# Patient Record
Sex: Male | Born: 1965 | Race: White | Hispanic: No | Marital: Married | Smoking: Former smoker
Health system: Southern US, Community
[De-identification: ages and names within clinical notes are randomized; demographics above are authoritative.]

## PROBLEM LIST (undated history)

## (undated) DIAGNOSIS — Z9989 Dependence on other enabling machines and devices: Secondary | ICD-10-CM

## (undated) DIAGNOSIS — R7303 Prediabetes: Secondary | ICD-10-CM

## (undated) DIAGNOSIS — G4700 Insomnia, unspecified: Secondary | ICD-10-CM

## (undated) DIAGNOSIS — G4733 Obstructive sleep apnea (adult) (pediatric): Secondary | ICD-10-CM

## (undated) DIAGNOSIS — R12 Heartburn: Secondary | ICD-10-CM

## (undated) DIAGNOSIS — I10 Essential (primary) hypertension: Secondary | ICD-10-CM

## (undated) DIAGNOSIS — K5909 Other constipation: Secondary | ICD-10-CM

## (undated) DIAGNOSIS — E119 Type 2 diabetes mellitus without complications: Secondary | ICD-10-CM

## (undated) HISTORY — DX: Type 2 diabetes mellitus without complications: E11.9

## (undated) HISTORY — DX: Dependence on other enabling machines and devices: Z99.89

## (undated) HISTORY — DX: Obstructive sleep apnea (adult) (pediatric): G47.33

## (undated) HISTORY — DX: Essential (primary) hypertension: I10

## (undated) HISTORY — DX: Morbid (severe) obesity due to excess calories: E66.01

## (undated) HISTORY — DX: Insomnia, unspecified: G47.00

## (undated) HISTORY — DX: Prediabetes: R73.03

## (undated) HISTORY — DX: Other constipation: K59.09

---

## 1998-01-09 ENCOUNTER — Other Ambulatory Visit: Admission: RE | Admit: 1998-01-09 | Discharge: 1998-01-09 | Payer: Self-pay | Admitting: Family Medicine

## 1998-06-30 ENCOUNTER — Encounter: Payer: Self-pay | Admitting: Family Medicine

## 1998-06-30 ENCOUNTER — Ambulatory Visit (HOSPITAL_COMMUNITY): Admission: RE | Admit: 1998-06-30 | Discharge: 1998-06-30 | Payer: Self-pay | Admitting: Family Medicine

## 1998-08-23 ENCOUNTER — Emergency Department (HOSPITAL_COMMUNITY): Admission: EM | Admit: 1998-08-23 | Discharge: 1998-08-23 | Payer: Self-pay

## 1998-08-25 ENCOUNTER — Encounter: Admission: RE | Admit: 1998-08-25 | Discharge: 1998-11-23 | Payer: Self-pay | Admitting: *Deleted

## 1998-09-22 ENCOUNTER — Encounter: Payer: Self-pay | Admitting: *Deleted

## 1999-05-20 ENCOUNTER — Ambulatory Visit: Admission: RE | Admit: 1999-05-20 | Discharge: 1999-05-20 | Payer: Self-pay | Admitting: Occupational Medicine

## 1999-05-20 ENCOUNTER — Encounter: Payer: Self-pay | Admitting: Occupational Medicine

## 1999-10-05 HISTORY — PX: HAND SURGERY: SHX662

## 2005-04-21 ENCOUNTER — Encounter: Admission: RE | Admit: 2005-04-21 | Discharge: 2005-04-21 | Payer: Self-pay | Admitting: Neurological Surgery

## 2005-05-10 ENCOUNTER — Encounter: Admission: RE | Admit: 2005-05-10 | Discharge: 2005-05-10 | Payer: Self-pay | Admitting: Neurological Surgery

## 2005-05-31 ENCOUNTER — Encounter: Admission: RE | Admit: 2005-05-31 | Discharge: 2005-05-31 | Payer: Self-pay | Admitting: Neurological Surgery

## 2005-07-12 ENCOUNTER — Encounter: Admission: RE | Admit: 2005-07-12 | Discharge: 2005-07-12 | Payer: Self-pay | Admitting: Neurological Surgery

## 2005-07-14 ENCOUNTER — Ambulatory Visit (HOSPITAL_COMMUNITY): Admission: RE | Admit: 2005-07-14 | Discharge: 2005-07-14 | Payer: Self-pay | Admitting: Orthopedic Surgery

## 2005-08-23 ENCOUNTER — Encounter: Admission: RE | Admit: 2005-08-23 | Discharge: 2005-08-23 | Payer: Self-pay | Admitting: Neurological Surgery

## 2005-10-04 HISTORY — PX: BACK SURGERY: SHX140

## 2005-10-12 ENCOUNTER — Encounter: Admission: RE | Admit: 2005-10-12 | Discharge: 2005-10-12 | Payer: Self-pay | Admitting: Neurological Surgery

## 2006-05-05 ENCOUNTER — Inpatient Hospital Stay (HOSPITAL_COMMUNITY): Admission: RE | Admit: 2006-05-05 | Discharge: 2006-05-11 | Payer: Self-pay | Admitting: Neurological Surgery

## 2006-06-10 ENCOUNTER — Encounter: Admission: RE | Admit: 2006-06-10 | Discharge: 2006-06-10 | Payer: Self-pay | Admitting: Neurological Surgery

## 2006-07-25 ENCOUNTER — Encounter: Admission: RE | Admit: 2006-07-25 | Discharge: 2006-07-25 | Payer: Self-pay | Admitting: Neurological Surgery

## 2006-12-28 ENCOUNTER — Encounter: Admission: RE | Admit: 2006-12-28 | Discharge: 2006-12-28 | Payer: Self-pay | Admitting: Neurological Surgery

## 2007-10-05 HISTORY — PX: OTHER SURGICAL HISTORY: SHX169

## 2008-04-06 ENCOUNTER — Inpatient Hospital Stay (HOSPITAL_COMMUNITY): Admission: EM | Admit: 2008-04-06 | Discharge: 2008-04-06 | Payer: Self-pay | Admitting: Emergency Medicine

## 2008-08-22 ENCOUNTER — Encounter (HOSPITAL_COMMUNITY): Admission: RE | Admit: 2008-08-22 | Discharge: 2008-10-02 | Payer: Self-pay | Admitting: Orthopaedic Surgery

## 2008-10-08 ENCOUNTER — Encounter (HOSPITAL_COMMUNITY): Admission: RE | Admit: 2008-10-08 | Discharge: 2008-11-07 | Payer: Self-pay | Admitting: Orthopaedic Surgery

## 2008-11-08 ENCOUNTER — Encounter (HOSPITAL_COMMUNITY): Admission: RE | Admit: 2008-11-08 | Discharge: 2008-12-08 | Payer: Self-pay | Admitting: Orthopaedic Surgery

## 2008-12-10 ENCOUNTER — Encounter (HOSPITAL_COMMUNITY): Admission: RE | Admit: 2008-12-10 | Discharge: 2009-01-09 | Payer: Self-pay | Admitting: Orthopaedic Surgery

## 2009-01-14 ENCOUNTER — Encounter (HOSPITAL_COMMUNITY): Admission: RE | Admit: 2009-01-14 | Discharge: 2009-03-06 | Payer: Self-pay | Admitting: Orthopaedic Surgery

## 2009-03-13 ENCOUNTER — Encounter (HOSPITAL_COMMUNITY): Admission: RE | Admit: 2009-03-13 | Discharge: 2009-04-12 | Payer: Self-pay | Admitting: Orthopaedic Surgery

## 2009-04-15 ENCOUNTER — Encounter (HOSPITAL_COMMUNITY): Admission: RE | Admit: 2009-04-15 | Discharge: 2009-05-15 | Payer: Self-pay | Admitting: Orthopaedic Surgery

## 2009-05-20 ENCOUNTER — Encounter (HOSPITAL_COMMUNITY): Admission: RE | Admit: 2009-05-20 | Discharge: 2009-06-19 | Payer: Self-pay | Admitting: Orthopaedic Surgery

## 2009-10-04 HISTORY — PX: OTHER SURGICAL HISTORY: SHX169

## 2010-04-16 ENCOUNTER — Encounter (HOSPITAL_COMMUNITY): Admission: RE | Admit: 2010-04-16 | Discharge: 2010-05-16 | Payer: Self-pay | Admitting: Orthopaedic Surgery

## 2010-05-19 ENCOUNTER — Encounter (HOSPITAL_COMMUNITY): Admission: RE | Admit: 2010-05-19 | Discharge: 2010-06-18 | Payer: Self-pay | Admitting: Orthopaedic Surgery

## 2011-02-16 NOTE — Op Note (Signed)
NAMEHANNAH, CRILL             ACCOUNT NO.:  [id number]   MEDICAL RECORD NO.:  [id number]          PATIENT TYPE:  INP   LOCATION:  2550                         FACILITY:  [facility]   PHYSICIAN:  Claude Manges. Whitfield, M.D.DATE OF BIRTH:  1966-01-01   DATE OF PROCEDURE:  04/06/2008  DATE OF DISCHARGE:                               OPERATIVE REPORT   PREOPERATIVE DIAGNOSES:  1. Open fractures, right calcaneus and talus with medial subtalar      dislocation.  2. Close comminuted fracture, left talus with lateral foot dislocation      ( subtalar).   PROCEDURES:  1. Irrigation and debridement, open fractures right foot with      reduction of subtalar dislocation application or short-leg splint.  2. Closed reduction of subtalar dislocation left foot, application of      short leg splint surgery.   SURGEON:  Claude Manges. Cleophas Dunker, MD.   ASSISTANT:  Legrand Pitts. Duffy, PA-C.   ANESTHESIA:  General.   COMPLICATIONS:  None.   PROCEDURE:  With the patient comfortable on operating table and under  general orotracheal anesthesia, nursing staff inserted a Foley catheter.  Urine was clear.   We approached the left foot first, there was a lateral subtalar  dislocation.  There was good capillary refill.  The patient related that  he had normal sensibility to his toes preoperatively.  With flexing the  knee, I was able to reduce the subtalar dislocation, so that the foot  appeared to be neutral.  Preoperative films revealed a comminuted  fracture of the talus involving at least the talar neck.  There may have  been other fragments, but because of the dislocation it was difficult to  obtain the accurate films.  Postreduction films revealed what may be a  small fragment laterally and the majority of the talus medially.  The  talonavicular joint appeared to be intact.  I thought we had much better  capillary refill to the foot after reduction.  The foot was then left in  the position and it was  stable.   The right foot was then approached.  There was a 6 cm laceration on the  plantar aspect of the foot at the level of the of the heel, extending  longitudinally and distally.  There was a T-type laceration to the  center of the longitudinal, 6 cm laceration that extended about 3 cm.  There were fragments of bone in the entrance to the wound and there was  what appeared to be plantar fascia visible the wound.   I debrided the edges of the skin and then irrigated with a bulb syringe  7000 mL.  I did not see any gross contamination.  There was a medial  dislocation of the foot and appeared to be majority of this was  subtalar, but there were multiple fracture fragments involving the os  calcis and the talus after reduction.  As though, the talonavicular  joint was intact.  There may be fractures of the cuboid as well.  I  tried to align these as best I could and then further irrigated.  I did  use a tourniquet for approximately 15-20 minutes.  The tourniquet was  deflated.  I packed the wound with Betadine soaked gauzes.  I feel that  this was going to be redebrided probably within next 24 to 48 hours and  then perhaps, vacuum dressing could to be applied.  There was slow  capillary refill to the toe, but after about 3 minutes, there was a  brisk capillary refill.   A sterile bulky dressing was applied followed by a posterior splint  maintaining the neutral position of the foot.   The patient was then awoken and returned to the post anesthesia recovery  room in the satisfactory condition.      Claude Manges. Cleophas Dunker, M.D.  Electronically Signed     PWW/MEDQ  D:  04/06/2008  T:  04/06/2008  Job:  [id number]

## 2011-02-19 NOTE — Discharge Summary (Signed)
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   PROCEDURE:  Posterior lumbar interbody fusion L4-5, L5-S1 with segmental  instrumentation.   BRIEF HISTORY OF PRESENT ILLNESS:  Mr. Bienvenue is a 45 year old white male  who injured his back at work over a year ago and has been out of work since  that time.  He had suffered a minor compression fracture in the upper lumbar  spine.  This was well healed.  However, he had spondylosis at L4-5 and L5-S1  with annular tears and disk herniation with degenerative disc disease at  those levels.  He underwent pain management for quite some time without  relief.  He had a diskogram which was positive at L4-5 and L5-S1 with a  negative control at L3-4.  He was referred back to me for consideration of  lumbar fusion.  We discussed the risks, benefits and expected outcome of the  procedure.  The patient wished to proceed.   HOSPITAL COURSE:  The patient was admitted on May 05, 2006, taken to the  operating room where we underwent a posterior lumbar interbody fusion L4-5,  L5-S1.  The patient the procedure well and was taken to the recovery room  and to the floor in stable condition.  For details of the operative  procedure, please see the dictated operative note.   The patient's hospital course was fairly routine.  He did have a  postoperative ileus.  He was initially on Dilaudid PCA for pain control.  This was discontinued after a couple of days and he was placed on OxyContin,  Percocet and Valium.  He developed an ileus with this pain  regimen.  This  was changed to Percocet and Robaxin and he was started on Reglan.  He then  had no more nausea.  He had flatus and had two watery bowel movements.  Acute abdominal series was consistent with an ileus.  This was done on  May 10, 2006.  On the evening of May 10, 2006, he had improvement in  his symptoms after being placed on Reglan.  Again, he had flatus and he  began to tolerate a regular diet without nausea.  His pain was well-  controlled with oral pain medications.  He was ambulating without  difficulty.  His incision was clean, dry and intact.  He  was afebrile with  stable vital signs.  He was discharged to home May 11, 2006, in stable  condition.   DISCHARGE MEDICATIONS:  1. Percocet 5/325 one to two p.o. q.4-6h. p.r.n. pain 120 pills with no      refills.  2. Robaxin 750 mg one p.o. t.i.d. 60  pills with two refills.   He was to call for any unusual redness, tenderness, swelling or drainage  from his wound or any temperature over 101.5.  He demonstrated understanding  of all discharge instructions and had all of his discharge questions  answered to his satisfaction.  Follow-up is in one week with Dr. Yetta Barre.   FINAL DIAGNOSIS:  Posterior lumbar interbody fusion with segmental  instrumentation L4-S1.      Tia Alert, MD  Electronically Signed     DSJ/MEDQ  D:  05/11/2006  T:  05/11/2006  Job:  [id number]

## 2011-07-01 LAB — CBC
HCT: 37.7 — ABNORMAL LOW
HCT: 42.2
Hemoglobin: 13
Hemoglobin: 14.3
MCHC: 34.3
MCV: 83.7
RBC: 4.51
RBC: 5.05

## 2011-07-01 LAB — DIFFERENTIAL
Eosinophils Relative: 1
Lymphocytes Relative: 16
Monocytes Absolute: 1.1 — ABNORMAL HIGH
Monocytes Relative: 7
Neutro Abs: 12.6 — ABNORMAL HIGH

## 2011-07-01 LAB — POCT I-STAT, CHEM 8
BUN: 16
Chloride: 103
Creatinine, Ser: 1.4
Potassium: 3.3 — ABNORMAL LOW
Sodium: 143

## 2011-07-01 LAB — BASIC METABOLIC PANEL
CO2: 30
Calcium: 8.7
Chloride: 104
GFR calc Af Amer: >60
Potassium: 4.6
Sodium: 139

## 2011-07-01 LAB — APTT: aPTT: 27

## 2011-11-18 DIAGNOSIS — R7309 Other abnormal glucose: Secondary | ICD-10-CM | POA: Diagnosis not present

## 2011-11-18 DIAGNOSIS — N529 Male erectile dysfunction, unspecified: Secondary | ICD-10-CM | POA: Diagnosis not present

## 2011-11-18 DIAGNOSIS — M549 Dorsalgia, unspecified: Secondary | ICD-10-CM | POA: Diagnosis not present

## 2011-11-18 DIAGNOSIS — G479 Sleep disorder, unspecified: Secondary | ICD-10-CM | POA: Diagnosis not present

## 2011-11-18 DIAGNOSIS — I10 Essential (primary) hypertension: Secondary | ICD-10-CM | POA: Diagnosis not present

## 2011-11-18 DIAGNOSIS — E669 Obesity, unspecified: Secondary | ICD-10-CM | POA: Diagnosis not present

## 2011-11-18 DIAGNOSIS — R5383 Other fatigue: Secondary | ICD-10-CM | POA: Diagnosis not present

## 2012-03-02 DIAGNOSIS — S88119A Complete traumatic amputation at level between knee and ankle, unspecified lower leg, initial encounter: Secondary | ICD-10-CM | POA: Diagnosis not present

## 2012-05-05 DIAGNOSIS — F659 Paraphilia, unspecified: Secondary | ICD-10-CM | POA: Diagnosis not present

## 2012-05-06 DIAGNOSIS — F659 Paraphilia, unspecified: Secondary | ICD-10-CM | POA: Diagnosis not present

## 2012-05-08 DIAGNOSIS — F659 Paraphilia, unspecified: Secondary | ICD-10-CM | POA: Diagnosis not present

## 2012-06-12 DIAGNOSIS — F659 Paraphilia, unspecified: Secondary | ICD-10-CM | POA: Diagnosis not present

## 2012-08-07 DIAGNOSIS — F659 Paraphilia, unspecified: Secondary | ICD-10-CM | POA: Diagnosis not present

## 2012-09-04 DIAGNOSIS — F659 Paraphilia, unspecified: Secondary | ICD-10-CM | POA: Diagnosis not present

## 2012-09-18 DIAGNOSIS — I10 Essential (primary) hypertension: Secondary | ICD-10-CM | POA: Diagnosis not present

## 2012-09-18 DIAGNOSIS — E669 Obesity, unspecified: Secondary | ICD-10-CM | POA: Diagnosis not present

## 2012-09-18 DIAGNOSIS — G4700 Insomnia, unspecified: Secondary | ICD-10-CM | POA: Diagnosis not present

## 2012-09-18 DIAGNOSIS — M549 Dorsalgia, unspecified: Secondary | ICD-10-CM | POA: Diagnosis not present

## 2012-09-18 DIAGNOSIS — Z23 Encounter for immunization: Secondary | ICD-10-CM | POA: Diagnosis not present

## 2012-09-18 DIAGNOSIS — E119 Type 2 diabetes mellitus without complications: Secondary | ICD-10-CM | POA: Diagnosis not present

## 2012-09-18 DIAGNOSIS — G4730 Sleep apnea, unspecified: Secondary | ICD-10-CM | POA: Diagnosis not present

## 2012-10-16 DIAGNOSIS — F659 Paraphilia, unspecified: Secondary | ICD-10-CM | POA: Diagnosis not present

## 2012-10-19 DIAGNOSIS — E669 Obesity, unspecified: Secondary | ICD-10-CM | POA: Diagnosis not present

## 2012-10-19 DIAGNOSIS — R05 Cough: Secondary | ICD-10-CM | POA: Diagnosis not present

## 2012-10-19 DIAGNOSIS — Z6841 Body Mass Index (BMI) 40.0 and over, adult: Secondary | ICD-10-CM | POA: Diagnosis not present

## 2012-10-30 DIAGNOSIS — I10 Essential (primary) hypertension: Secondary | ICD-10-CM | POA: Diagnosis not present

## 2012-10-30 DIAGNOSIS — G4733 Obstructive sleep apnea (adult) (pediatric): Secondary | ICD-10-CM | POA: Diagnosis not present

## 2012-11-06 DIAGNOSIS — F659 Paraphilia, unspecified: Secondary | ICD-10-CM | POA: Diagnosis not present

## 2012-11-29 ENCOUNTER — Ambulatory Visit (INDEPENDENT_AMBULATORY_CARE_PROVIDER_SITE_OTHER): Payer: Medicare Other | Admitting: General Surgery

## 2013-01-12 ENCOUNTER — Ambulatory Visit (INDEPENDENT_AMBULATORY_CARE_PROVIDER_SITE_OTHER): Payer: BC Managed Care – PPO | Admitting: Surgery

## 2013-01-12 ENCOUNTER — Other Ambulatory Visit (INDEPENDENT_AMBULATORY_CARE_PROVIDER_SITE_OTHER): Payer: Self-pay

## 2013-01-12 ENCOUNTER — Encounter (INDEPENDENT_AMBULATORY_CARE_PROVIDER_SITE_OTHER): Payer: Self-pay | Admitting: Surgery

## 2013-01-12 VITALS — BP 130/79 | HR 78 | Temp 98.6°F | Resp 22 | Ht 71.0 in | Wt >= 6400 oz

## 2013-01-12 DIAGNOSIS — Z9989 Dependence on other enabling machines and devices: Secondary | ICD-10-CM

## 2013-01-12 DIAGNOSIS — Z6841 Body Mass Index (BMI) 40.0 and over, adult: Secondary | ICD-10-CM

## 2013-01-12 DIAGNOSIS — G4733 Obstructive sleep apnea (adult) (pediatric): Secondary | ICD-10-CM | POA: Insufficient documentation

## 2013-01-12 NOTE — Patient Instructions (Signed)
Laparoscopic Gastric Band Surgery Care After These instructions give you information on caring for yourself after your procedure. Your doctor may also give you more specific instructions. Call your doctor if you have any problems or questions after your procedure. HOME CARE   Take walks throughout the day. Do not sit for longer than 1 hour while awake for 4 to 6 weeks.  You may shower 2 days after surgery. Pat the surgery cuts (incisions) dry. Do not rub the surgery cuts.  Do your coughing and deep breathing exercises.  Do not lift, push, or pull anything heavy until your doctor says it is okay.  Only take medicines as told by your doctor. Do not drive while taking pain medicine.  Drink plenty of fluids to keep your pee (urine) clear or pale yellow.  Stay on a clear liquid diet as long as your doctor tells you to.  Do not drink caffeine for 1 month.  Change bandages (dressings) as told by your doctor.  Check your surgery cuts for redness, pufffiness (swelling), abnormal coloring, fluid, or bleeding.  Follow your doctor's advice about vitamin and protein needs after surgery. GET HELP RIGHT AWAY IF:  You feel sick to your stomach (nauseous) and throw up (vomit).  You have pain and discomfort with swallowing.  You develop shortness of breath or difficulty breathing.  You have pain, puffiness, or feel warmth on your lower body.  You have very bad calf pain or pain not relieved by medicine.  You have a temperature by mouth above 102 F (38.9 C).  Your surgery cuts look red, puffy, or they leak fluid.  Your poop (stool) is black, tarry, or dark red.  You have chills.  You have chest pain.  You feel confused.  You have slurred speech.  You feel lightheaded when standing.  You suddenly feel weak.  You have any questions or concerns. MAKE SURE YOU:   Understand these instructions.  Will watch your condition.  Will get help right away if you are not doing well or  get worse. Document Released: 10/23/2010 Document Revised: 12/13/2011 Document Reviewed: 10/23/2010 [facility] Patient Information 2013 [place], [place].
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
## 2013-01-26 ENCOUNTER — Ambulatory Visit (HOSPITAL_COMMUNITY): Payer: Medicare Other

## 2013-01-26 ENCOUNTER — Ambulatory Visit (HOSPITAL_COMMUNITY): Admission: RE | Admit: 2013-01-26 | Payer: Medicare Other | Source: Ambulatory Visit

## 2013-01-29 ENCOUNTER — Ambulatory Visit (HOSPITAL_COMMUNITY)
Admission: RE | Admit: 2013-01-29 | Discharge: 2013-01-29 | Disposition: A | Discharge status: Home / Self Care | Payer: BC Managed Care – PPO | Source: Ambulatory Visit | Attending: Surgery | Admitting: Surgery

## 2013-01-29 ENCOUNTER — Other Ambulatory Visit: Payer: Self-pay

## 2013-01-29 DIAGNOSIS — K7689 Other specified diseases of liver: Secondary | ICD-10-CM | POA: Diagnosis not present

## 2013-01-29 DIAGNOSIS — G4733 Obstructive sleep apnea (adult) (pediatric): Secondary | ICD-10-CM | POA: Diagnosis not present

## 2013-01-29 DIAGNOSIS — I10 Essential (primary) hypertension: Secondary | ICD-10-CM | POA: Diagnosis not present

## 2013-01-29 DIAGNOSIS — Z6841 Body Mass Index (BMI) 40.0 and over, adult: Secondary | ICD-10-CM | POA: Insufficient documentation

## 2013-01-29 DIAGNOSIS — E669 Obesity, unspecified: Secondary | ICD-10-CM | POA: Diagnosis not present

## 2013-02-01 ENCOUNTER — Encounter: Payer: BC Managed Care – PPO | Attending: Surgery | Admitting: *Deleted

## 2013-02-01 ENCOUNTER — Encounter: Payer: Self-pay | Admitting: *Deleted

## 2013-02-01 DIAGNOSIS — Z01818 Encounter for other preprocedural examination: Secondary | ICD-10-CM | POA: Insufficient documentation

## 2013-02-01 DIAGNOSIS — Z713 Dietary counseling and surveillance: Secondary | ICD-10-CM | POA: Insufficient documentation

## 2013-02-01 NOTE — Progress Notes (Signed)
  Pre-Op Assessment Visit:  Pre-Operative LAGB Surgery  Medical Nutrition Therapy:  Appt start time: 0915   End time:  1015.  Patient was seen on 02/01/2013 for Pre-Operative LAGB Nutrition Assessment. Assessment and letter of approval faxed to [facility] Bariatric Surgery Program coordinator on 02/01/2013.  Approval letter sent to [facility] center and will be available in the chart under the media tab.  Handouts given during visit include:  Pre-Op Goals   Bariatric Surgery Protein Shakes  Patient to call for Pre-Op and Post-Op Nutrition Education at [facility] when surgery is scheduled.

## 2013-02-01 NOTE — Patient Instructions (Addendum)
   Follow Pre-Op Nutrition Goals to prepare for Lapband Surgery.   Call the [facility] at [phone] once you have been given your surgery date to enrolled in the Pre-Op Nutrition Class. You will need to attend this nutrition class 3-4 weeks prior to your surgery. 

## 2013-02-05 ENCOUNTER — Ambulatory Visit (HOSPITAL_COMMUNITY)
Admission: RE | Admit: 2013-02-05 | Discharge: 2013-02-05 | Disposition: A | Discharge status: Home / Self Care | Payer: BC Managed Care – PPO | Source: Ambulatory Visit | Attending: Surgery | Admitting: Surgery

## 2013-02-05 ENCOUNTER — Encounter (HOSPITAL_COMMUNITY)
Admission: RE | Disposition: A | Discharge status: Home / Self Care | Payer: Self-pay | Source: Ambulatory Visit | Attending: Surgery

## 2013-02-05 SURGERY — BREATH TEST, FOR HELICOBACTER PYLORI

## 2013-02-28 ENCOUNTER — Encounter (INDEPENDENT_AMBULATORY_CARE_PROVIDER_SITE_OTHER): Payer: Self-pay

## 2013-04-26 ENCOUNTER — Encounter: Payer: Medicare Other | Attending: Surgery

## 2013-04-26 DIAGNOSIS — Z713 Dietary counseling and surveillance: Secondary | ICD-10-CM | POA: Diagnosis not present

## 2013-04-26 DIAGNOSIS — Z01818 Encounter for other preprocedural examination: Secondary | ICD-10-CM | POA: Diagnosis not present

## 2013-04-30 NOTE — Progress Notes (Signed)
Need orders in EPIC.  Surgery scheduled for 05/14/13.  Thank You.   

## 2013-05-03 ENCOUNTER — Encounter (HOSPITAL_COMMUNITY): Payer: Self-pay | Admitting: Pharmacy Technician

## 2013-05-08 ENCOUNTER — Other Ambulatory Visit (HOSPITAL_COMMUNITY): Payer: Self-pay | Admitting: Surgery

## 2013-05-08 NOTE — Progress Notes (Signed)
DR Daphine Deutscher-   Need PRE OP ORDERS PLEASE---has PST appt 05/09/13  [facility]

## 2013-05-08 NOTE — Patient Instructions (Addendum)
[address]  05/08/2013   Your procedure is scheduled on:  05/14/13  MONDAY  Report to [facility] at  0515     AM.  Call this number if you have problems the morning of surgery: [phone]       Remember: BRING CPAP MASK AND TUBING WITH YOU TO HOSPITAL  Do not eat food  Or drink :After Midnight. Sunday NIGHT   Take these medicines the morning of surgery with A SIP OF WATER: Zantac                               May take Metocarbamol, or Oxycodone IR if needed   .  Contacts, dentures or partial plates can not be worn to surgery  Leave suitcase in the car. After surgery it may be brought to your room.  For patients admitted to the hospital, checkout time is 11:00 AM day of  discharge.             SPECIAL INSTRUCTIONS- SEE [facility] PREPARING FOR SURGERY INSTRUCTION SHEET-     DO NOT WEAR JEWELRY, LOTIONS, POWDERS, OR PERFUMES.  WOMEN-- DO NOT SHAVE LEGS OR UNDERARMS FOR 12 HOURS BEFORE SHOWERS. MEN MAY SHAVE FACE.  Patients discharged the day of surgery will not be allowed to drive home. IF going home the day of surgery, you must have a driver and someone to stay with you for the first 24 hours  Name and phone number of your driver:   Admission    Wife  Nehemiah Massed  PST [phone]  [phone]                 FAILURE TO FOLLOW THESE INSTRUCTIONS MAY RESULT IN  CANCELLATION   OF YOUR SURGERY                                                  Patient Signature _____________________________

## 2013-05-08 NOTE — Progress Notes (Signed)
Chest x ray, EKG 4/14  EPIC

## 2013-05-08 NOTE — Progress Notes (Signed)
Sleep study report  11/13, LOV Dr Mayford Knife 1/14 on chart

## 2013-05-09 ENCOUNTER — Encounter (HOSPITAL_COMMUNITY)
Admission: RE | Admit: 2013-05-09 | Discharge: 2013-05-09 | Disposition: A | Discharge status: Home / Self Care | Payer: BC Managed Care – PPO | Source: Ambulatory Visit | Attending: Surgery | Admitting: Surgery

## 2013-05-09 ENCOUNTER — Encounter (HOSPITAL_COMMUNITY): Payer: Self-pay

## 2013-05-09 DIAGNOSIS — Z6841 Body Mass Index (BMI) 40.0 and over, adult: Secondary | ICD-10-CM | POA: Diagnosis not present

## 2013-05-09 DIAGNOSIS — Z7982 Long term (current) use of aspirin: Secondary | ICD-10-CM | POA: Diagnosis not present

## 2013-05-09 DIAGNOSIS — K449 Diaphragmatic hernia without obstruction or gangrene: Secondary | ICD-10-CM | POA: Diagnosis not present

## 2013-05-09 DIAGNOSIS — Z79899 Other long term (current) drug therapy: Secondary | ICD-10-CM | POA: Diagnosis not present

## 2013-05-09 DIAGNOSIS — I10 Essential (primary) hypertension: Secondary | ICD-10-CM | POA: Diagnosis not present

## 2013-05-09 DIAGNOSIS — G4733 Obstructive sleep apnea (adult) (pediatric): Secondary | ICD-10-CM | POA: Diagnosis not present

## 2013-05-09 HISTORY — DX: Heartburn: R12

## 2013-05-09 LAB — BASIC METABOLIC PANEL
CO2: 32 mEq/L (ref 19–32)
Calcium: 10 mg/dL (ref 8.4–10.5)
Chloride: 91 mEq/L — ABNORMAL LOW (ref 96–112)
Sodium: 133 mEq/L — ABNORMAL LOW (ref 135–145)

## 2013-05-09 LAB — CBC
MCV: 81.2 fL (ref 78.0–100.0)
Platelets: 265 10*3/uL (ref 150–400)
RBC: 5.47 MIL/uL (ref 4.22–5.81)
WBC: 6.5 10*3/uL (ref 4.0–10.5)

## 2013-05-09 NOTE — Progress Notes (Signed)
Spoke with Trula Ore in portable equipment and notified her of need for bariatric bed day of surgery post op

## 2013-05-10 ENCOUNTER — Ambulatory Visit (INDEPENDENT_AMBULATORY_CARE_PROVIDER_SITE_OTHER): Payer: BC Managed Care – PPO | Admitting: Surgery

## 2013-05-10 ENCOUNTER — Other Ambulatory Visit (INDEPENDENT_AMBULATORY_CARE_PROVIDER_SITE_OTHER): Payer: Self-pay | Admitting: Surgery

## 2013-05-10 ENCOUNTER — Encounter (INDEPENDENT_AMBULATORY_CARE_PROVIDER_SITE_OTHER): Payer: Self-pay | Admitting: Surgery

## 2013-05-10 DIAGNOSIS — G4733 Obstructive sleep apnea (adult) (pediatric): Secondary | ICD-10-CM

## 2013-05-10 DIAGNOSIS — Z6841 Body Mass Index (BMI) 40.0 and over, adult: Secondary | ICD-10-CM
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
## 2013-05-10 NOTE — Patient Instructions (Signed)

## 2013-05-12 NOTE — Patient Instructions (Signed)
Follow:   Pre-Op Diet per MD 2 weeks prior to surgery  Phase 2- Liquids (clear/full) 2 weeks after surgery  Vitamin/Mineral/Calcium guidelines for purchasing bariatric supplements  Exercise guidelines pre and post-op per MD  Follow-up at [facility] in 2 weeks post-op for diet advancement. Contact Morena Mckissack as needed with questions/concerns. 

## 2013-05-12 NOTE — Progress Notes (Addendum)
Bariatric Class:  Appt start time: 0830 end time:  0930.  Pre-Operative Nutrition Class  Patient was seen on 04/26/13 for Pre-Operative Bariatric Surgery Education at [facility].   Surgery date: 05/14/13 Surgery type: LAGB Start weight at [facility]: 413.3 lbs (02/01/13) Goal weight: 210 lbs  Weight today: TBA Weight change: TBA Total weight lost: TBA  Samples given per MNT protocol; Patient educated on appropriate usage: Bariatric Advantage Multivitamin Lot # [id number] Exp: 06/15  Bariatric Advantage Calcium Citrate Lot # [id number] Exp: 03/15  Bariatric Advantage B12 Lot # [id number] Exp: 10/15  Celebrate Vitamins Multivitamin Lot # [id number] Exp: 07/15  Celebrate Vitamins Calcium Citrate Lot # [id number] Exp: 09/15  Corliss Marcus Protein Powder Lot # [id number] Exp: 09/15  The following the learning objectives met by the patient during this course:  Identify Pre-Op Dietary Goals and will begin 2 weeks pre-operatively  Identify appropriate sources of fluids and proteins   State protein recommendations and appropriate sources pre and post-operatively  Identify Post-Operative Dietary Goals and will follow for 2 weeks post-operatively  Identify appropriate multivitamin and calcium sources  Describe the need for physical activity post-operatively and will follow MD recommendations  State when to call healthcare provider regarding medication questions or post-operative complications  Handouts given during class include:  Pre-Op Bariatric Surgery Diet Handout  Protein Shake Handout  Post-Op Bariatric Surgery Nutrition Handout  [facility] Program Information Flyer  Support Group Information Flyer  [facility] Bariatric Supplements Price List  Follow-Up Plan: Patient will follow-up at [facility] 2 weeks post operatively for diet advancement per MD.

## 2013-05-14 ENCOUNTER — Encounter (HOSPITAL_COMMUNITY): Payer: Self-pay | Admitting: Anesthesiology

## 2013-05-14 ENCOUNTER — Encounter (HOSPITAL_COMMUNITY): Payer: Self-pay | Admitting: *Deleted

## 2013-05-14 ENCOUNTER — Observation Stay (HOSPITAL_COMMUNITY)
Admission: RE | Admit: 2013-05-14 | Discharge: 2013-05-15 | Disposition: A | Discharge status: Home / Self Care | Payer: BC Managed Care – PPO | Source: Ambulatory Visit | Attending: Surgery | Admitting: Surgery

## 2013-05-14 ENCOUNTER — Encounter (HOSPITAL_COMMUNITY)
Admission: RE | Disposition: A | Discharge status: Home / Self Care | Payer: Self-pay | Source: Ambulatory Visit | Attending: Surgery

## 2013-05-14 ENCOUNTER — Inpatient Hospital Stay (HOSPITAL_COMMUNITY): Payer: BC Managed Care – PPO | Admitting: Anesthesiology

## 2013-05-14 DIAGNOSIS — K449 Diaphragmatic hernia without obstruction or gangrene: Secondary | ICD-10-CM | POA: Insufficient documentation

## 2013-05-14 DIAGNOSIS — Z6841 Body Mass Index (BMI) 40.0 and over, adult: Secondary | ICD-10-CM | POA: Diagnosis not present

## 2013-05-14 DIAGNOSIS — Z79899 Other long term (current) drug therapy: Secondary | ICD-10-CM | POA: Insufficient documentation

## 2013-05-14 DIAGNOSIS — G4733 Obstructive sleep apnea (adult) (pediatric): Secondary | ICD-10-CM | POA: Diagnosis not present

## 2013-05-14 DIAGNOSIS — I10 Essential (primary) hypertension: Secondary | ICD-10-CM | POA: Insufficient documentation

## 2013-05-14 DIAGNOSIS — Z7982 Long term (current) use of aspirin: Secondary | ICD-10-CM | POA: Insufficient documentation

## 2013-05-14 DIAGNOSIS — Z9884 Bariatric surgery status: Secondary | ICD-10-CM

## 2013-05-14 HISTORY — PX: MESH APPLIED TO LAP PORT: SHX5969

## 2013-05-14 HISTORY — PX: LAPAROSCOPIC GASTRIC BANDING: SHX1100

## 2013-05-14 LAB — CBC
HCT: 40.5 % (ref 39.0–52.0)
Hemoglobin: 13.8 g/dL (ref 13.0–17.0)
MCH: 27.4 pg (ref 26.0–34.0)
MCHC: 34.1 g/dL (ref 30.0–36.0)
RDW: 13.5 % (ref 11.5–15.5)

## 2013-05-14 LAB — CREATININE, SERUM: GFR calc non Af Amer: 70 mL/min — ABNORMAL LOW (ref >90)

## 2013-05-14 LAB — GLUCOSE, CAPILLARY

## 2013-05-14 SURGERY — GASTRIC BANDING, LAPAROSCOPIC
Anesthesia: General | Site: Abdomen | Wound class: Clean

## 2013-05-14 MED ORDER — LACTATED RINGERS IV SOLN
INTRAVENOUS | Status: DC | PRN
  Administered 2013-05-14 (×2): via INTRAVENOUS

## 2013-05-14 MED ORDER — OXYCODONE-ACETAMINOPHEN 5-325 MG/5ML PO SOLN: 5.0000 mL | ORAL | Status: DC | PRN

## 2013-05-14 MED ORDER — NEOSTIGMINE METHYLSULFATE 1 MG/ML IJ SOLN
INTRAMUSCULAR | Status: DC | PRN
  Administered 2013-05-14: 5 mg via INTRAVENOUS

## 2013-05-14 MED ORDER — ONDANSETRON HCL 4 MG/2ML IJ SOLN
4.0000 mg | INTRAMUSCULAR | Status: DC | PRN
  Administered 2013-05-15: 4 mg via INTRAVENOUS
  Filled 2013-05-14: qty 2

## 2013-05-14 MED ORDER — KCL IN DEXTROSE-NACL 20-5-0.45 MEQ/L-%-% IV SOLN
INTRAVENOUS | Status: DC
  Administered 2013-05-14 – 2013-05-15 (×3): via INTRAVENOUS
  Filled 2013-05-14 (×4): qty 1000

## 2013-05-14 MED ORDER — BUPIVACAINE LIPOSOME 1.3 % IJ SUSP
20.0000 mL | Freq: Once | INTRAMUSCULAR | Status: DC
  Filled 2013-05-14: qty 20

## 2013-05-14 MED ORDER — ROCURONIUM BROMIDE 100 MG/10ML IV SOLN
INTRAVENOUS | Status: DC | PRN
  Administered 2013-05-14: 40 mg via INTRAVENOUS

## 2013-05-14 MED ORDER — BUPIVACAINE LIPOSOME 1.3 % IJ SUSP
INTRAMUSCULAR | Status: DC | PRN
  Administered 2013-05-14: 20 mL

## 2013-05-14 MED ORDER — METHOCARBAMOL 500 MG PO TABS: 750.0000 mg | ORAL_TABLET | Freq: Three times a day (TID) | ORAL | Status: DC | PRN

## 2013-05-14 MED ORDER — SUCCINYLCHOLINE CHLORIDE 20 MG/ML IJ SOLN
INTRAMUSCULAR | Status: DC | PRN
  Administered 2013-05-14: 140 mg via INTRAVENOUS

## 2013-05-14 MED ORDER — PROPOFOL 10 MG/ML IV BOLUS
INTRAVENOUS | Status: DC | PRN
  Administered 2013-05-14: 200 mg via INTRAVENOUS

## 2013-05-14 MED ORDER — SODIUM CHLORIDE 0.9 % IJ SOLN
INTRAMUSCULAR | Status: DC | PRN
  Administered 2013-05-14: 20 mL via INTRAVENOUS

## 2013-05-14 MED ORDER — HYDROCHLOROTHIAZIDE 25 MG PO TABS
25.0000 mg | ORAL_TABLET | Freq: Every day | ORAL | Status: DC
  Administered 2013-05-14 – 2013-05-15 (×2): 25 mg via ORAL
  Filled 2013-05-14 (×2): qty 1

## 2013-05-14 MED ORDER — UNJURY CHICKEN SOUP POWDER: 2.0000 [oz_av] | Freq: Four times a day (QID) | ORAL | Status: DC

## 2013-05-14 MED ORDER — UNJURY CHOCOLATE CLASSIC POWDER
2.0000 [oz_av] | Freq: Four times a day (QID) | ORAL | Status: DC
  Administered 2013-05-15: 2 [oz_av] via ORAL

## 2013-05-14 MED ORDER — LOSARTAN POTASSIUM 50 MG PO TABS
100.0000 mg | ORAL_TABLET | Freq: Every day | ORAL | Status: DC
  Administered 2013-05-14 – 2013-05-15 (×2): 100 mg via ORAL
  Filled 2013-05-14 (×2): qty 2

## 2013-05-14 MED ORDER — MORPHINE SULFATE 2 MG/ML IJ SOLN
2.0000 mg | INTRAMUSCULAR | Status: DC | PRN
Start: 2013-05-14 — End: 2013-05-15
  Administered 2013-05-14: 2 mg via INTRAVENOUS
  Administered 2013-05-14: 4 mg via INTRAVENOUS
  Filled 2013-05-14: qty 2
  Filled 2013-05-14: qty 1

## 2013-05-14 MED ORDER — GLYCOPYRROLATE 0.2 MG/ML IJ SOLN
INTRAMUSCULAR | Status: DC | PRN
  Administered 2013-05-14: 0.6 mg via INTRAVENOUS

## 2013-05-14 MED ORDER — ONDANSETRON HCL 4 MG/2ML IJ SOLN
INTRAMUSCULAR | Status: DC | PRN
  Administered 2013-05-14: 4 mg via INTRAVENOUS

## 2013-05-14 MED ORDER — ACETAMINOPHEN 160 MG/5ML PO SOLN: 650.0000 mg | ORAL | Status: DC | PRN

## 2013-05-14 MED ORDER — HEPARIN SODIUM (PORCINE) 5000 UNIT/ML IJ SOLN
5000.0000 [IU] | INTRAMUSCULAR | Status: AC
  Administered 2013-05-14: 5000 [IU] via SUBCUTANEOUS
  Filled 2013-05-14: qty 1

## 2013-05-14 MED ORDER — ZOLPIDEM TARTRATE 10 MG PO TABS: 10.0000 mg | ORAL_TABLET | Freq: Every evening | ORAL | Status: DC | PRN

## 2013-05-14 MED ORDER — MIDAZOLAM HCL 5 MG/5ML IJ SOLN
INTRAMUSCULAR | Status: DC | PRN
  Administered 2013-05-14: 2 mg via INTRAVENOUS

## 2013-05-14 MED ORDER — FENTANYL CITRATE 0.05 MG/ML IJ SOLN
INTRAMUSCULAR | Status: DC | PRN
  Administered 2013-05-14 (×4): 50 ug via INTRAVENOUS

## 2013-05-14 MED ORDER — 0.9 % SODIUM CHLORIDE (POUR BTL) OPTIME
TOPICAL | Status: DC | PRN
  Administered 2013-05-14: 1000 mL

## 2013-05-14 MED ORDER — FENTANYL CITRATE 0.05 MG/ML IJ SOLN
25.0000 ug | INTRAMUSCULAR | Status: DC | PRN
  Administered 2013-05-14: 50 ug via INTRAVENOUS
  Administered 2013-05-14: 25 ug via INTRAVENOUS

## 2013-05-14 MED ORDER — DEXTROSE 5 % IV SOLN
2.0000 g | INTRAVENOUS | Status: AC
  Administered 2013-05-14: 2 g via INTRAVENOUS
  Filled 2013-05-14: qty 2

## 2013-05-14 MED ORDER — PROMETHAZINE HCL 25 MG/ML IJ SOLN: 6.2500 mg | INTRAMUSCULAR | Status: DC | PRN

## 2013-05-14 MED ORDER — HEPARIN SODIUM (PORCINE) 5000 UNIT/ML IJ SOLN
5000.0000 [IU] | Freq: Three times a day (TID) | INTRAMUSCULAR | Status: DC
  Administered 2013-05-14 – 2013-05-15 (×3): 5000 [IU] via SUBCUTANEOUS
  Filled 2013-05-14 (×6): qty 1

## 2013-05-14 MED ORDER — LOSARTAN POTASSIUM-HCTZ 100-25 MG PO TABS: 1.0000 | ORAL_TABLET | Freq: Every morning | ORAL | Status: DC

## 2013-05-14 MED ORDER — LIDOCAINE HCL (CARDIAC) 20 MG/ML IV SOLN
INTRAVENOUS | Status: DC | PRN
  Administered 2013-05-14: 100 mg via INTRAVENOUS
  Administered 2013-05-14: 50 mg via INTRAVENOUS

## 2013-05-14 MED ORDER — UNJURY VANILLA POWDER: 2.0000 [oz_av] | Freq: Four times a day (QID) | ORAL | Status: DC

## 2013-05-14 SURGICAL SUPPLY — 65 items
ADH SKN CLS APL DERMABOND .7 (GAUZE/BANDAGES/DRESSINGS) ×2
APL SKNCLS STERI-STRIP NONHPOA (GAUZE/BANDAGES/DRESSINGS)
BAND LAP VG SYSTEM W/TUBES (Band) IMPLANT
BENZOIN TINCTURE PRP APPL 2/3 (GAUZE/BANDAGES/DRESSINGS) IMPLANT
BLADE HEX COATED 2.75 (ELECTRODE) ×3 IMPLANT
BLADE SURG 15 STRL LF DISP TIS (BLADE) ×2 IMPLANT
BLADE SURG 15 STRL SS (BLADE) ×3
CANISTER SUCTION 2500CC (MISCELLANEOUS) ×2 IMPLANT
CLOTH BEACON ORANGE TIMEOUT ST (SAFETY) ×3 IMPLANT
COVER SURGICAL LIGHT HANDLE (MISCELLANEOUS) ×2 IMPLANT
DECANTER SPIKE VIAL GLASS SM (MISCELLANEOUS) ×6 IMPLANT
DERMABOND ADVANCED (GAUZE/BANDAGES/DRESSINGS) ×1
DERMABOND ADVANCED .7 DNX12 (GAUZE/BANDAGES/DRESSINGS) IMPLANT
DEVICE SUT QUICK LOAD TK 5 (STAPLE) ×9 IMPLANT
DEVICE SUT TI-KNOT TK 5X26 (MISCELLANEOUS) ×3 IMPLANT
DEVICE SUTURE ENDOST 10MM (ENDOMECHANICALS) IMPLANT
DISSECTOR BLUNT TIP ENDO 5MM (MISCELLANEOUS) IMPLANT
DRAPE CAMERA CLOSED 9X96 (DRAPES) ×3 IMPLANT
ELECT REM PT RETURN 9FT ADLT (ELECTROSURGICAL) ×3
ELECTRODE REM PT RTRN 9FT ADLT (ELECTROSURGICAL) ×2 IMPLANT
GLOVE BIOGEL M 8.0 STRL (GLOVE) ×3 IMPLANT
GLOVE BIOGEL PI IND STRL 7.0 (GLOVE) ×2 IMPLANT
GLOVE BIOGEL PI INDICATOR 7.0 (GLOVE) ×1
GOWN STRL NON-REIN LRG LVL3 (GOWN DISPOSABLE) ×2 IMPLANT
GOWN STRL REIN XL XLG (GOWN DISPOSABLE) ×10 IMPLANT
HOVERMATT SINGLE USE (MISCELLANEOUS) ×3 IMPLANT
KIT BASIN OR (CUSTOM PROCEDURE TRAY) ×3 IMPLANT
LAP BAND VG SYSTEM W/TUBES (Band) ×3 IMPLANT
MESH HERNIA 1X4 RECT BARD (Mesh General) IMPLANT
MESH HERNIA BARD 1X4 (Mesh General) ×1 IMPLANT
NDL SPNL 22GX3.5 QUINCKE BK (NEEDLE) ×2 IMPLANT
NEEDLE SPNL 22GX3.5 QUINCKE BK (NEEDLE) ×3 IMPLANT
NS IRRIG 1000ML POUR BTL (IV SOLUTION) ×3 IMPLANT
PACK UNIVERSAL I (CUSTOM PROCEDURE TRAY) ×3 IMPLANT
PENCIL BUTTON HOLSTER BLD 10FT (ELECTRODE) ×3 IMPLANT
SCALPEL HARMONIC ACE (MISCELLANEOUS) IMPLANT
SET IRRIG TUBING LAPAROSCOPIC (IRRIGATION / IRRIGATOR) IMPLANT
SHEARS CURVED HARMONIC AC 45CM (MISCELLANEOUS) IMPLANT
SLEEVE ADV FIXATION 5X100MM (TROCAR) IMPLANT
SLEEVE Z-THREAD 5X100MM (TROCAR) IMPLANT
SOLUTION ANTI FOG 6CC (MISCELLANEOUS) ×3 IMPLANT
SPONGE GAUZE 4X4 12PLY (GAUZE/BANDAGES/DRESSINGS) ×2 IMPLANT
SPONGE LAP 18X18 X RAY DECT (DISPOSABLE) ×3 IMPLANT
STAPLER VISISTAT 35W (STAPLE) ×3 IMPLANT
STRIP CLOSURE SKIN 1/2X4 (GAUZE/BANDAGES/DRESSINGS) IMPLANT
SUT ETHIBOND 2 0 SH (SUTURE) ×9
SUT ETHIBOND 2 0 SH 36X2 (SUTURE) ×6 IMPLANT
SUT PROLENE 2 0 CT2 30 (SUTURE) ×3 IMPLANT
SUT SILK 0 (SUTURE)
SUT SILK 0 30XBRD TIE 6 (SUTURE) IMPLANT
SUT SURGIDAC NAB ES-9 0 48 120 (SUTURE) IMPLANT
SUT VIC AB 2-0 SH 27 (SUTURE)
SUT VIC AB 2-0 SH 27X BRD (SUTURE) IMPLANT
SUT VIC AB 4-0 SH 18 (SUTURE) ×4 IMPLANT
SYR 20CC LL (SYRINGE) ×3 IMPLANT
SYR 30ML LL (SYRINGE) ×3 IMPLANT
TOWEL OR 17X26 10 PK STRL BLUE (TOWEL DISPOSABLE) ×6 IMPLANT
TROCAR ADV FIXATION 11X100MM (TROCAR) ×2 IMPLANT
TROCAR BLADELESS 15MM (ENDOMECHANICALS) ×1 IMPLANT
TROCAR BLADELESS OPT 5 100 (ENDOMECHANICALS) ×2 IMPLANT
TROCAR XCEL NON-BLD 11X100MML (ENDOMECHANICALS) ×3 IMPLANT
TROCAR XCEL NON-BLD 5MMX100MML (ENDOMECHANICALS) ×1 IMPLANT
TROCAR XCEL UNIV SLVE 11M 100M (ENDOMECHANICALS) IMPLANT
TUBE CALIBRATION LAPBAND (TUBING) ×3 IMPLANT
TUBING INSUFFLATION 10FT LAP (TUBING) ×3 IMPLANT

## 2013-05-14 NOTE — Interval H&P Note (Signed)
History and Physical Interval Note:  05/14/2013 7:11 AM  Joel [name]  has presented today for surgery, with the diagnosis of morbid obesity   The various methods of treatment have been discussed with the patient and family. After consideration of risks, benefits and other options for treatment, the patient has consented to  Procedure(s): LAPAROSCOPIC GASTRIC BANDING (N/A) as a surgical intervention .  The patient's history has been reviewed, patient examined, no change in status, stable for surgery.  I have reviewed the patient's chart and labs.  Questions were answered to the patient's satisfaction.     [name] B

## 2013-05-14 NOTE — Transfer of Care (Signed)
Immediate Anesthesia Transfer of Care Note  Patient: Joel [name]  Procedure(s) Performed: Procedure(s): LAPAROSCOPIC GASTRIC BANDING (N/A) MESH APPLIED TO LAP PORT  Patient Location: PACU  Anesthesia Type:General  Level of Consciousness: awake, alert , oriented and patient cooperative  Airway & Oxygen Therapy: Patient Spontanous Breathing and Patient connected to face mask oxygen  Post-op Assessment: Report given to PACU RN and Post -op Vital signs reviewed and stable  Post vital signs: Reviewed and stable  Complications: No apparent anesthesia complications

## 2013-05-14 NOTE — Progress Notes (Signed)
Utilization review completed.  

## 2013-05-14 NOTE — Progress Notes (Signed)
Placed patient on Cpap of 17 per patient and 3lpm o2 bleed in.  Patient has his own mask and tubing.  Patient is tolerating well.

## 2013-05-14 NOTE — Op Note (Signed)
05/14/2013  Surgeon: Wenda Low, MD, FACS Asst:  Ovidio Kin, MD, FACS  Procedure: Laparoscopic adjustable gastric banding with APL system  Anes:  General  EBL:  Minimal  Description of Procedure  The patient was taken to OR # 1 and given general anesthesia.  After a prep with PCMX the patient was draped and a timeout performed.  Access to the abdomen was achieved with a 0 degree 10 mm Optiview technique through the left upper quadrant.    Adhesions were not present.  Ports were placed to the the right of the midline including a 15 trocar in  the right upper quadrant placed obliquely.  The Satira Mccallum was used to retract the left lateral segment and the peritoneum was incised along the left crus.   The EJ junction as assessed for a hiatus hernia and no dimple was seen and patient had a negative UGI.  A balloon test was not performed.    The pars flaccida technique was utilized to insert the blunt "finger " dissector from right to left behind the stomach.  This created a target zone to pass the band passer.     The lapband APL  Had been previously flushed and was inserted through the 15 trocar.  It was placed in the tip of "the finger"  and pulled around behind the stomach.   The band was plicated with 3 sutures secured with Ty-Knots.  The tubing was brought out through the lower incision on the right and connected to the port which had mesh sewn onto the back and was placed into the subcutaneous pocket.  The incisions were injected with Exparel and closed with 4-0 vicryl and Dermabond.     The patient was taken to the PACU in stable condition.    Matt B. Daphine Deutscher, MD, [facility], [place] [phone]

## 2013-05-14 NOTE — Anesthesia Postprocedure Evaluation (Signed)
  Anesthesia Post-op Note  Patient: Joel [name]  Procedure(s) Performed: Procedure(s) (LRB): LAPAROSCOPIC GASTRIC BANDING (N/A) MESH APPLIED TO LAP PORT  Patient Location: PACU  Anesthesia Type: General  Level of Consciousness: awake and alert   Airway and Oxygen Therapy: Patient Spontanous Breathing  Post-op Pain: mild  Post-op Assessment: Post-op Vital signs reviewed, Patient's Cardiovascular Status Stable, Respiratory Function Stable, Patent Airway and No signs of Nausea or vomiting  Last Vitals:  Filed Vitals:   05/14/13 0915  BP: 121/53  Pulse: 58  Temp:   Resp: 18    Post-op Vital Signs: stable   Complications: No apparent anesthesia complications. To floor on OSA precautions. No apnea nor sedation in PACU.

## 2013-05-14 NOTE — Anesthesia Preprocedure Evaluation (Addendum)
Anesthesia Evaluation  Patient identified by MRN, date of birth, ID band Patient awake  General Assessment Comment:.  Hypertension     .  Morbid obesity     .  Prediabetes         Patient reported on 02/01/13   .  Heartburn     .  OSA on CPAP         SEVERE     Reviewed: Allergy & Precautions, H&P , NPO status , Patient's Chart, lab work & pertinent test results  Airway Mallampati: II TM Distance: >3 FB Neck ROM: Full    Dental  (+) Dental Advisory Given, Missing and Poor Dentition Missing many teeth.:   Pulmonary sleep apnea ,  breath sounds clear to auscultation  Pulmonary exam normal       Cardiovascular Exercise Tolerance: Good hypertension, Pt. on medications negative cardio ROS  Rhythm:Regular Rate:Normal  ECG and CXR from April were normal.   Neuro/Psych negative neurological ROS  negative psych ROS   GI/Hepatic negative GI ROS, Neg liver ROS,   Endo/Other  Morbid obesity  Renal/GU negative Renal ROS  negative genitourinary   Musculoskeletal negative musculoskeletal ROS (+)   Abdominal (+) + obese,   Peds negative pediatric ROS (+)  Hematology negative hematology ROS (+)   Anesthesia Other Findings   Reproductive/Obstetrics negative OB ROS                         Anesthesia Physical Anesthesia Plan  ASA: III  Anesthesia Plan: General   Post-op Pain Management:    Induction: Intravenous  Airway Management Planned: Oral ETT  Additional Equipment:   Intra-op Plan:   Post-operative Plan: Extubation in OR  Informed Consent: I have reviewed the patients History and Physical, chart, labs and discussed the procedure including the risks, benefits and alternatives for the proposed anesthesia with the patient or authorized representative who has indicated his/her understanding and acceptance.   Dental advisory given  Plan Discussed with: CRNA  Anesthesia Plan Comments:          Anesthesia Quick Evaluation
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
## 2013-05-14 NOTE — Preoperative (Signed)
Beta Blockers   Reason not to administer Beta Blockers:Not Applicable 

## 2013-05-15 ENCOUNTER — Encounter (HOSPITAL_COMMUNITY): Payer: Self-pay | Admitting: Surgery

## 2013-05-15 ENCOUNTER — Observation Stay (HOSPITAL_COMMUNITY): Payer: BC Managed Care – PPO

## 2013-05-15 DIAGNOSIS — R141 Gas pain: Secondary | ICD-10-CM | POA: Diagnosis not present

## 2013-05-15 DIAGNOSIS — R143 Flatulence: Secondary | ICD-10-CM | POA: Diagnosis not present

## 2013-05-15 DIAGNOSIS — R142 Eructation: Secondary | ICD-10-CM | POA: Diagnosis not present

## 2013-05-15 LAB — CBC WITH DIFFERENTIAL/PLATELET
Basophils Relative: 0 % (ref 0–1)
Eosinophils Absolute: 0.1 10*3/uL (ref 0.0–0.7)
Eosinophils Relative: 2 % (ref 0–5)
Hemoglobin: 13.5 g/dL (ref 13.0–17.0)
Lymphs Abs: 2.1 10*3/uL (ref 0.7–4.0)
MCH: 26.7 pg (ref 26.0–34.0)
MCHC: 32.7 g/dL (ref 30.0–36.0)
MCV: 81.6 fL (ref 78.0–100.0)
Monocytes Absolute: 1.3 10*3/uL — ABNORMAL HIGH (ref 0.1–1.0)
Monocytes Relative: 17 % — ABNORMAL HIGH (ref 3–12)
Neutrophils Relative %: 53 % (ref 43–77)
RBC: 5.06 MIL/uL (ref 4.22–5.81)

## 2013-05-15 MED ORDER — OXYCODONE-ACETAMINOPHEN 5-325 MG/5ML PO SOLN: 5.0000 mL | ORAL | Status: DC | PRN

## 2013-05-15 NOTE — Progress Notes (Signed)
Patient is alert and oriented.  Pain is controlled, and patient is tolerating fluids.  Advanced to protein shake today.  Reviewed Adjustable gastric band discharge instructions with patient and wife.  Patient able to articulate understanding.    ADJUSTABLE GASTRIC BAND  Home Care Instructions  These instructions are to help you care for yourself when you go home.  Call: If you have any problems.   Call [phone] and ask for the surgeon on call   If you need immediate assistance come to the ER at [facility]. Tell the ER staff that you are a new post-op gastric banding patient   Signs and symptoms to report:   Severe vomiting or nausea o If you cannot handle clear liquids for longer than 1 day, call your surgeon    Abdominal pain which does not get better after taking your pain medication   Fever greater than 100.4 F and chills   Heart rate over 100 beats a minute   Trouble breathing   Chest pain    Redness, swelling, drainage, or foul odor at incision (surgical) sites    If your incisions open or pull apart   Swelling or pain in calf (lower leg)   Diarrhea (Loose bowel movements that happen often), frequent watery, uncontrolled bowel movements   Constipation, (no bowel movements for 3 days) if this happens:  o Take Milk of Magnesia, 2 tablespoons by mouth, 3 times a day for 2 days if needed o Stop taking Milk of Magnesia once you have had a bowel movement o Call your doctor if constipation continues Or o Take Miralax  (instead of Milk of Magnesia) following the label instructions o Stop taking Miralax once you have had a bowel movement o Call your doctor if constipation continues   Anything you think is "abnormal for you"   Normal side effects after surgery:   Unable to sleep at night or unable to concentrate   Irritability   Being tearful (crying) or depressed These are common complaints, possibly related to your anesthesia, stress of surgery, and change in lifestyle, that  usually go away a few weeks after surgery.  If these feelings continue, call your medical doctor.   Wound Care: You may have surgical glue, steri-strips, or staples over your incisions after surgery   Surgical glue:  Looks like a clear film over your incisions and will wear off a little at a time   Steri-strips : Adhesive strips of tape over your incisions. You may notice a yellowish color on the skin under the steri-strips. This is used to make the   steri-strips stick better. Do not pull the steri-strips off - let them fall off   Staples: Staples may be removed before you leave the hospital o If you go home with staples, call [facility] at for an appointment with your surgeon's nurse to have staples removed 10 days after surgery, [phone]   Showering: You may shower two (2) days after your surgery unless your surgeon tells you differently o Wash gently around incisions with warm soapy water, rinse well, and gently pat dry  o If you have a drain (tube from your incision), you may need someone to hold this while you shower  o No tub baths until staples are removed and incisions are healed     Medications:   Medications should be liquid or crushed if larger than the size of a dime   Extended release pills (medication that releases a little bit  at a time through the day) should not be crushed   Depending on the size and number of medications you take, you may need to space (take a few throughout the day)/change the time you take your medications so that you do not over-fill your pouch (smaller stomach)   Make sure you follow-up with your primary care physician to make medication changes needed during rapid weight loss and life-style changes   If you have diabetes, follow up with the doctor that orders your diabetes medication(s) within one week after surgery and check your blood sugar regularly.   Do not drive while taking narcotics (pain medications)   Do not take acetaminophen  (Tylenol) and Roxicet or Lortab Elixir at the same time since these pain medications contain acetaminophen  Diet:                    First 2 Weeks  You will see the nutritionist about two (2) weeks after your surgery. The nutritionist will increase the types of foods you can eat if you are handling liquids well:   If you have severe vomiting or nausea and cannot handle clear liquids lasting longer than 1 day, call your surgeon  For Same Day Surgery Discharge Patients:    The day of surgery drink water only: 2 ounces every 4 hours   If you are handling water, start drinking your high protein shake the next morning For Overnight Stay Patients:    Begin by drinking 2 ounces of a high protein every 3 hours, 5 - 6 times per day   Slowly increase the amount you drink as tolerated   You may find it easier to slowly sip shakes throughout the day.  It is important to get your proteins in first Protein Shake   Drink at least 2 ounces of shake 5-6 times per day   Each serving of protein shakes (usually 8 - 12 ounces) should have a minimum of:  o 15 grams of protein  o And no more than 5 grams of carbohydrate    Goal for protein each day: o Men = 80 grams per day o Women = 60 grams per day   Protein powder may be added to fluids such as non-fat milk or Lactaid milk or Soy milk (limit to 35 grams added protein powder per serving)  Hydration   Slowly increase the amount of water and other clear liquids as tolerated (See Acceptable Fluids)   Slowly increase the amount of protein shake as tolerated     Sip fluids slowly and throughout the day   May use sugar substitutes in small amounts (no more than 6 - 8 packets per day; i.e. Splenda)  Fluid Goal   The first goal is to drink at least 8 ounces of protein shake/drink per day (or as directed by the nutritionist); some examples of protein shakes are [employer], Dillard's, EAS Edge HP, and Unjury. See handout from pre-op Bariatric Education  Class: o Slowly increase the amount of protein shake you drink as tolerated o You may find it easier to slowly sip shakes throughout the day o It is important to get your proteins in first   Your fluid goal is to drink 64 - 100 ounces of fluid daily o It may take a few weeks to build up to this   32 oz (or more) should be clear liquids  And    32 oz (or more) should be full liquids (see below  for examples)   Liquids should not contain sugar, caffeine, or carbonation  Clear Liquids:   Water or Sugar-free flavored water (i.e. Fruit H2O, Propel)   Decaffeinated coffee or tea (sugar-free)   Crystal Lite, Wyler's Lite, Minute Maid Lite   Sugar-free Jell-O   Bouillon or broth   Sugar-free Popsicle:   *Less than 20 calories each; Limit 1 per day            Full Liquids: Protein Shakes/Drinks + 2 choices per day of other full liquids   Full liquids must be: o No More Than 12 grams of Carbs per serving  o No More Than 3 grams of Fat per serving   Strained low-fat cream soup   Non-Fat milk   Fat-free Lactaid Milk   Sugar-free yogurt (Dannon Lite & Fit, Greek yogurt)   Vitamins and Minerals   Start 1 day after surgery unless otherwise directed by your surgeon   1 Chewable Multivitamin / Multimineral Supplement with iron (i.e. Centrum for Adults)   Chewable Calcium Citrate with Vitamin D-3 (Example: 3 Chewable Calcium Plus 600 with Vitamin D-3) o Take 500 mg three (3) times a day for a total of 1500 mg each day o Do not take all 3 doses of calcium at one time as it may cause constipation, and you can only absorb 500 mg  at a time  o Do not mix multivitamins containing iron with calcium supplements; take 2 hours apart o Do not substitute Tums (calcium carbonate) for your calcium   Menstruating women and those at risk for anemia (a blood disease that causes weakness) may need extra iron o Talk with your doctor to see if you need more iron   If you need extra iron: Total daily Iron  recommendation (including Vitamins) is 50 to 100 mg Iron/day   Do not stop taking or change any vitamins or minerals until you talk to your nutritionist or surgeon   Your nutritionist and/or surgeon must approve all vitamin and mineral supplements  Activity and Exercise: It is important to continue walking at home.  Limit your physical activity as instructed by your doctor.  During this time, use these guidelines:   Do not lift anything greater than ten (10) pounds for at least two (2) weeks   Do not go back to work or drive until Designer, industrial/product says you can   You may have sex when you feel comfortable  o It is VERY important for male patients to use a reliable birth control method; fertility often increases after surgery  o Do not get pregnant for at least 18 months   Start exercising as soon as your doctor tells you that you can o Make sure your doctor approves any physical activity   Start with a simple walking program   Walk 5-15 minutes each day, 7 days per week.    Slowly increase until you are walking 30-45 minutes per day   Consider joining our BELT program. [phone] or email [email or web address]   Special Instructions Things to remember:   Free counseling is available for you and your family through collaboration between [facility] and [facility]. Please call [phone] and leave a message   Use your CPAP when sleeping if this applies to you    Consider buying a medical alert bracelet that says you had lap-band surgery    You will likely have your first fill (fluid added to your band) 6 - 8 weeks after surgery   [facility]  [facility] has a free Bariatric Surgery Support Group that meets monthly, the 3rd Thursday, 6 pm, [facility] Classrooms You can see classes online at [email or web address]   It is very important to keep all follow up appointments with your surgeon, nutritionist, primary care physician, and behavioral health practitioner o After the first year,  please follow up with your bariatric surgeon and nutritionist at least once a year in order to maintain best weight loss results [facility]: [phone] [facility]: [phone] Bariatric Nurse Coordinator: [phone]

## 2013-05-15 NOTE — Discharge Summary (Signed)
Physician Discharge Summary  Patient ID: Joel [name] MRN: [id number] DOB/AGE: 1966/02/10 47 y.o.  Admit date: 05/14/2013 Discharge date: 05/15/2013  Admission Diagnoses:  Morbid obesity  Discharge Diagnoses:  Same post lapband APL placement  Active Problems:   Lapband APL August 2014   Surgery:  Lapband APL  Discharged Condition: improved  Hospital Course:   Had lapband surgery.  Kept overnight.  Xray showed band in 2-8 position.  Ready for discharge  Consults: none  Significant Diagnostic Studies: xray of abdomen    Discharge Exam: Blood pressure 137/81, pulse 64, temperature 98 F (36.7 C), temperature source Oral, resp. rate 16, height 5\' 11"  (1.803 m), weight 378 lb 4 oz (171.573 kg), SpO2 95.00%. Nontender; dermabond on incisions  Disposition: 01-Home or Self Care  Discharge Orders   Future Appointments Provider Department Dept Phone   05/29/2013 4:00 PM [facility] Post-Op Class [facility] [phone]   06/07/2013 9:00 AM Valarie Merino, MD [facility], [place] [phone]   Future Orders Complete By Expires     Diet - low sodium heart healthy  As directed     Discharge instructions  As directed     Comments:      Per bariatric nurses    Increase activity slowly  As directed     No wound care  As directed         Medication List         aspirin 325 MG tablet  Take 325 mg by mouth at bedtime.     losartan-hydrochlorothiazide 100-25 MG per tablet  Commonly known as:  HYZAAR  Take 1 tablet by mouth every morning.     methocarbamol 750 MG tablet  Commonly known as:  ROBAXIN  Take 750 mg by mouth 3 (three) times daily as needed. Pain     oxyCODONE 5 MG immediate release tablet  Commonly known as:  Oxy IR/ROXICODONE  Take 5 mg by mouth every 6 (six) hours as needed for pain.     oxyCODONE-acetaminophen 5-325 MG/5ML solution  Commonly known as:  ROXICET  Take 5-10 mLs by mouth every 4 (four) hours as  needed.     ranitidine 75 MG tablet  Commonly known as:  ZANTAC  Take 75 mg by mouth daily.     zolpidem 10 MG tablet  Commonly known as:  AMBIEN  Take 10 mg by mouth at bedtime as needed for sleep.           Follow-up Information   Follow up with Valarie Merino, MD.   Contact information:   [address] [place] [address] [phone]       Signed: Valarie Merino 05/15/2013, 12:10 PM

## 2013-05-29 ENCOUNTER — Ambulatory Visit: Payer: Medicare Other | Admitting: *Deleted

## 2013-05-29 ENCOUNTER — Ambulatory Visit: Payer: Medicare Other

## 2013-06-07 ENCOUNTER — Ambulatory Visit (INDEPENDENT_AMBULATORY_CARE_PROVIDER_SITE_OTHER): Payer: BC Managed Care – PPO | Admitting: Surgery

## 2013-06-07 VITALS — BP 132/74 | HR 68 | Temp 98.8°F | Resp 18 | Ht 71.0 in | Wt 377.8 lb

## 2013-06-07 DIAGNOSIS — Z9884 Bariatric surgery status: Secondary | ICD-10-CM

## 2013-06-07 NOTE — Progress Notes (Signed)
Halina Maidens 47 y.o.  Body mass index is 52.72 kg/(m^2).  Patient Active Problem List   Diagnosis Date Noted  . 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 Amputee, below knee-right traumatic 01/12/2013  . Morbid obesity BMI 54 01/12/2013  . OSA on CPAP 01/12/2013    No Known Allergies  Past Surgical History  Procedure Laterality Date  . Hand surgery Right 2001  . Foot Bilateral 2009  . Foot ampulated Right 2011  . Back surgery  2007    fusion- lumbar  . Laparoscopic gastric banding N/A 05/14/2013    Procedure: LAPAROSCOPIC GASTRIC BANDING;  Surgeon: Valarie Merino, MD;  Location: [facility];  Service: General;  Laterality: N/A;  . Mesh applied to lap port  05/14/2013    Procedure: MESH APPLIED TO LAP PORT;  Surgeon: Valarie Merino, MD;  Location: [facility];  Service: General;;   Johny Blamer, MD No diagnosis found.  Doing well after lapband 3 weeks ago.  Answered questions and encouraged to do exercise with upper body weights.  Matt B. Daphine Deutscher, MD, [facility] [phone] beeper [phone]  06/07/2013 9:47 AM

## 2013-06-07 NOTE — Patient Instructions (Addendum)
These are the instructions that you will receive in the future after your lapband fills.    1. Stay on liquids for the next 2 days as you adapt to your new fill volume.  Then resume your previous diet. 2. Decreasing your carbohydrate intake will hasten your weight loss.  Rely more on proteins for your meals.  Avoid condiments that contain sweets such as Honey Mustard and sugary salad dressings.   3. Stay in the "green zone".  If you are regurgitating with meals, having night time reflux, and find yourself eating soft comfort foods (mashed potatoes, potato chips)...realize that you are developing "maladaptive eating".  You will not lose weight this way and may regain weight.  The GREEN ZONE is eating smaller portions and not regurgitating.  Hence we may need to withdraw fluid from your band. 4. Build exercise into your daily routine.  Walking is the best way to start but do something every day if you can.

## 2013-06-13 DIAGNOSIS — G4733 Obstructive sleep apnea (adult) (pediatric): Secondary | ICD-10-CM | POA: Diagnosis not present

## 2013-06-13 DIAGNOSIS — I10 Essential (primary) hypertension: Secondary | ICD-10-CM | POA: Diagnosis not present

## 2013-06-20 ENCOUNTER — Encounter: Payer: Self-pay | Admitting: Cardiology

## 2013-06-28 ENCOUNTER — Encounter: Payer: BC Managed Care – PPO | Attending: Surgery | Admitting: *Deleted

## 2013-06-28 ENCOUNTER — Encounter: Payer: Self-pay | Admitting: *Deleted

## 2013-06-28 ENCOUNTER — Ambulatory Visit (INDEPENDENT_AMBULATORY_CARE_PROVIDER_SITE_OTHER): Payer: BC Managed Care – PPO | Admitting: Surgery

## 2013-06-28 ENCOUNTER — Encounter (INDEPENDENT_AMBULATORY_CARE_PROVIDER_SITE_OTHER): Payer: Self-pay | Admitting: Surgery

## 2013-06-28 VITALS — BP 127/76 | HR 70 | Temp 97.0°F | Resp 20 | Ht 71.0 in | Wt 362.4 lb

## 2013-06-28 DIAGNOSIS — Z713 Dietary counseling and surveillance: Secondary | ICD-10-CM | POA: Insufficient documentation

## 2013-06-28 DIAGNOSIS — Z01818 Encounter for other preprocedural examination: Secondary | ICD-10-CM | POA: Insufficient documentation

## 2013-06-28 DIAGNOSIS — Z4651 Encounter for fitting and adjustment of gastric lap band: Secondary | ICD-10-CM

## 2013-06-28 NOTE — Progress Notes (Signed)
  Follow-up visit:  6 Weeks Post-Operative LAGB Surgery  Medical Nutrition Therapy:  Appt start time: 1100   End time: 1145.  Primary concerns today: Post-operative Bariatric Surgery Nutrition Management. Joel [name] returns for first f/u since surgery. Doing very well.  Advanced to Phase 3B once he can return to solid foods s/p band fill he received this morning.   Surgery date: 05/14/13 Surgery type: LAGB Start weight at [facility]: 413.3 lbs (02/01/13)  Weight today: 360.8 lbs Weight change: TBA Total weight lost: 52.5 lbs  Goal weight: 210 lbs % goal met: 26%  Fluid intake: Coffee ([employer]), water = 64+ oz Estimated total protein intake: Taking as directed  Medications:  See med list. Reconciled with pt during visit Supplementation: Taking MVI, but no calcium d/t severe constipation  Using straws: No Drinking while eating: No Hair loss: No Carbonated beverages: No N/V/D/C:  One episode of severe chest pain/pressure from 2 hotdogs with cheese & bun that got stuck; states "I paid dearly". Reports severe constipation from calcium. Last Lap-Band fill:  Today (06/28/13) - 0.9 cc  Recent physical activity:  Walks/runs 1 mile/day @ 22 min on treadmill; increasing.  Progress Towards Goal(s):  In progress.  Handouts given during visit include:  Phase 3B: High Protein + Non-Starchy Vegetables  Samples given during visit include:   PB2: 2 pkts Lot: NONE; Exp: 10/16/13   Nutritional Diagnosis:  Mead-3.3 Overweight/obesity related to past poor dietary habits and physical inactivity as evidenced by patient w/ recent LAGB surgery following dietary guidelines for continued weight loss.  Intervention:  Nutrition education/diet advancement.  Monitoring/Evaluation:  Dietary intake, exercise, lap band fills, and body weight. Follow up in 6 weeks for 3 month post-op visit.

## 2013-06-28 NOTE — Patient Instructions (Addendum)
Goals:  Follow Phase 3B: High Protein + Non-Starchy Vegetables  Start once you can return to solid foods  Eat 3-6 small meals/snacks, every 3-5 hrs  Increase lean protein foods to meet 60-80g goal  Increase fluid intake to 64oz +  Avoid drinking 15 minutes before, during and 30 minutes after eating  Aim for >30 min of physical activity daily  Resume taking calcium. Try to get at least 2 doses (1000 mg) daily.

## 2013-06-28 NOTE — Patient Instructions (Signed)

## 2013-06-28 NOTE — Progress Notes (Signed)
Lapband Fill Encounter Problem List:   Patient Active Problem List   Diagnosis Date Noted  . 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 Amputee, below knee-right traumatic 01/12/2013  . Morbid obesity BMI 54 01/12/2013  . OSA on CPAP 01/12/2013    Halina Maidens Body mass index is 50.57 kg/(m^2). Weight loss since surgery  30  Having regurgitation?:  no  Feel that they need a fill?  yes  Nocturnal reflux?  no  Amount of fill  0.9     Instructions given and weight loss goals discussed.  Able to drink water.    Discussed portion control and avoiding carbs.  Return 6 weeks  Matt B. Daphine Deutscher, MD, FACS

## 2013-07-06 ENCOUNTER — Encounter: Payer: Self-pay | Admitting: Cardiology

## 2013-07-18 DIAGNOSIS — I10 Essential (primary) hypertension: Secondary | ICD-10-CM | POA: Diagnosis not present

## 2013-07-18 DIAGNOSIS — R7309 Other abnormal glucose: Secondary | ICD-10-CM | POA: Diagnosis not present

## 2013-07-18 DIAGNOSIS — M549 Dorsalgia, unspecified: Secondary | ICD-10-CM | POA: Diagnosis not present

## 2013-07-18 DIAGNOSIS — K602 Anal fissure, unspecified: Secondary | ICD-10-CM | POA: Diagnosis not present

## 2013-08-09 ENCOUNTER — Ambulatory Visit: Payer: Medicare Other | Admitting: *Deleted

## 2013-08-09 ENCOUNTER — Other Ambulatory Visit: Payer: Self-pay

## 2013-08-10 ENCOUNTER — Ambulatory Visit (INDEPENDENT_AMBULATORY_CARE_PROVIDER_SITE_OTHER): Payer: BC Managed Care – PPO | Admitting: Surgery

## 2013-08-10 ENCOUNTER — Encounter (INDEPENDENT_AMBULATORY_CARE_PROVIDER_SITE_OTHER): Payer: Self-pay | Admitting: Surgery

## 2013-08-10 VITALS — BP 132/78 | HR 82 | Temp 97.2°F | Resp 18 | Ht 71.0 in | Wt 349.2 lb

## 2013-08-10 DIAGNOSIS — Z9884 Bariatric surgery status: Secondary | ICD-10-CM

## 2013-08-10 NOTE — Progress Notes (Signed)
Lapband Fill Encounter Problem List:   Patient Active Problem List   Diagnosis Date Noted  . 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 Amputee, below knee-right traumatic 01/12/2013  . Morbid obesity BMI 54 01/12/2013  . OSA on CPAP 01/12/2013    Halina Maidens Body mass index is 48.73 kg/(m^2). Weight loss since surgery  42.7  Having regurgitation?:  no  Feel that they need a fill?  1  Nocturnal reflux?  no  Amount of fill  1     Instructions given and weight loss goals discussed.    Yes.  Keep on high protein diet.   Matt B. Daphine Deutscher, MD, FACS

## 2013-08-10 NOTE — Patient Instructions (Signed)

## 2013-08-14 ENCOUNTER — Ambulatory Visit: Payer: Medicare Other | Admitting: *Deleted

## 2013-09-21 ENCOUNTER — Encounter (INDEPENDENT_AMBULATORY_CARE_PROVIDER_SITE_OTHER): Payer: Self-pay | Admitting: Surgery

## 2013-09-21 ENCOUNTER — Ambulatory Visit (INDEPENDENT_AMBULATORY_CARE_PROVIDER_SITE_OTHER): Payer: BC Managed Care – PPO | Admitting: Surgery

## 2013-09-21 VITALS — BP 134/68 | HR 62 | Temp 98.0°F | Resp 18 | Ht 71.0 in | Wt 324.4 lb

## 2013-09-21 DIAGNOSIS — Z4651 Encounter for fitting and adjustment of gastric lap band: Secondary | ICD-10-CM | POA: Diagnosis not present

## 2013-09-21 DIAGNOSIS — Z9884 Bariatric surgery status: Secondary | ICD-10-CM

## 2013-09-21 NOTE — Patient Instructions (Signed)

## 2013-09-21 NOTE — Progress Notes (Signed)
Lapband Fill Encounter Problem List:   Patient Active Problem List   Diagnosis Date Noted  . 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 Amputee, below knee-right traumatic 01/12/2013  . Morbid obesity BMI 54 01/12/2013  . OSA on CPAP 01/12/2013    Halina Maidens Body mass index is 45.26 kg/(m^2). Weight loss since surgery  67.5  Having regurgitation?:  no  Feel that they need a fill?  yes  Nocturnal reflux?  no  Amount of fill  0.5     Instructions given and weight loss goals discussed.    Talked about maladaptive eating.  He is restricted but not too much.  Added 0.5 cc to his band and I will see again in 6 weeks.  Not to green zone yet.    Matt B. Daphine Deutscher, MD, FACS

## 2013-11-02 ENCOUNTER — Encounter (INDEPENDENT_AMBULATORY_CARE_PROVIDER_SITE_OTHER): Payer: BC Managed Care – PPO | Admitting: Surgery

## 2013-11-02 ENCOUNTER — Telehealth (INDEPENDENT_AMBULATORY_CARE_PROVIDER_SITE_OTHER): Payer: Self-pay | Admitting: General Surgery

## 2013-11-02 NOTE — Telephone Encounter (Signed)
LMOM letting the pt know that we rescheduled his appt with [facility] on 11/08/13 at 8:30 w/ an AT of 8:15. An appt card has also been mailed to his home.

## 2013-11-08 ENCOUNTER — Encounter (INDEPENDENT_AMBULATORY_CARE_PROVIDER_SITE_OTHER): Payer: Self-pay

## 2013-11-08 ENCOUNTER — Ambulatory Visit (INDEPENDENT_AMBULATORY_CARE_PROVIDER_SITE_OTHER): Payer: BC Managed Care – PPO | Admitting: Physician Assistant

## 2013-11-08 VITALS — BP 128/90 | HR 72 | Temp 98.4°F | Resp 14 | Ht 71.0 in | Wt 302.2 lb

## 2013-11-08 DIAGNOSIS — Z4651 Encounter for fitting and adjustment of gastric lap band: Secondary | ICD-10-CM | POA: Diagnosis not present

## 2013-11-08 NOTE — Patient Instructions (Signed)

## 2013-11-08 NOTE — Progress Notes (Signed)
  HISTORY: Joel [name] is a 48 y.o.male who received an AP-Large lap-band in August 2014 by Dr. Daphine DeutscherMartin. He comes in with 22 lbs weight loss since his last visit in December. He has lost almost 90 lbs since surgery in August. I congratulated him on his progress. He admits disappointment in not getting below 300 lbs this visit, which was his goal. I reassured him that his progress was stellar. He denies regurgitation or reflux symptoms but he still is capable of eating more than he desires. He reports not feeling 'restriction'. He is exercising 5 days a week.  VITAL SIGNS: Filed Vitals:   11/08/13 0835  BP: 128/90  Pulse: 72  Temp: 98.4 F (36.9 C)  Resp: 14    PHYSICAL EXAM: Physical exam reveals a very well-appearing 47 y.o.male in no apparent distress Neurologic: Awake, alert, oriented Psych: Bright affect, conversant Respiratory: Breathing even and unlabored. No stridor or wheezing Abdomen: Soft, nontender, nondistended to palpation. Incisions well-healed. No incisional hernias. Port easily palpated. Extremities: Atraumatic, good range of motion.  ASSESMENT: 48 y.o.  male  s/p AP-Large lap-band.   PLAN: The patient's port was accessed with a 20G Huber needle without difficulty. Clear fluid was aspirated and 0.5 mL saline was added to the port. The patient was able to swallow water without difficulty following the procedure and was instructed to take clear liquids for the next 24-48 hours and advance slowly as tolerated.

## 2013-12-06 ENCOUNTER — Ambulatory Visit (INDEPENDENT_AMBULATORY_CARE_PROVIDER_SITE_OTHER): Payer: BLUE CROSS/BLUE SHIELD | Admitting: Physician Assistant

## 2013-12-06 ENCOUNTER — Encounter (INDEPENDENT_AMBULATORY_CARE_PROVIDER_SITE_OTHER): Payer: Self-pay

## 2013-12-06 VITALS — BP 130/82 | HR 80 | Temp 97.0°F | Resp 18 | Ht 71.0 in | Wt 295.0 lb

## 2013-12-06 DIAGNOSIS — Z4651 Encounter for fitting and adjustment of gastric lap band: Secondary | ICD-10-CM | POA: Diagnosis not present

## 2013-12-06 NOTE — Patient Instructions (Signed)

## 2013-12-06 NOTE — Progress Notes (Signed)
  HISTORY: Joel MaidensMichael H [name] is a 48 y.o.male who received an AP-Large lap-band in August 2014 by Dr. Daphine DeutscherMartin. He comes in today with 7 lbs weight loss since his last visit. He still describes not feeling 'restriction' but he's noting that he's eating far less than before. He describes a fullness that prompts him to stop eating. He says he eats about 1/3 of what he used to. He's walking 2.5 miles a day on the treadmill, up from 2 miles previously. He's very pleased with his progress but he wants to get down to 200 lbs by August.  VITAL SIGNS: Filed Vitals:   12/06/13 0854  BP: 130/82  Pulse: 80  Temp: 97 F (36.1 C)  Resp: 18    PHYSICAL EXAM: Physical exam reveals a very well-appearing 47 y.o.male in no apparent distress Neurologic: Awake, alert, oriented Psych: Bright affect, conversant Respiratory: Breathing even and unlabored. No stridor or wheezing Abdomen: Soft, nontender, nondistended to palpation. Incisions well-healed. No incisional hernias. Port easily palpated. Extremities: Atraumatic, good range of motion.  ASSESMENT: 48 y.o.  male  s/p AP-Large lap-band.   PLAN: The patient's port was accessed with a 20G Huber needle without difficulty. Clear fluid was aspirated and 0.5 mL saline was added to the port. The patient was able to swallow water without difficulty following the procedure and was instructed to take clear liquids for the next 24-48 hours and advance slowly as tolerated. We talked about realistic expectations and the fact that he's close to 100 lbs down from his pre-op weight already.  I encouraged him to continue his exercise routine and to watch his intake. We'll have him back in one month or sooner if needed.

## 2013-12-12 ENCOUNTER — Ambulatory Visit: Payer: Medicare Other | Admitting: Cardiology

## 2013-12-19 ENCOUNTER — Other Ambulatory Visit: Payer: Self-pay | Admitting: General Surgery

## 2013-12-19 ENCOUNTER — Telehealth: Payer: Self-pay | Admitting: General Surgery

## 2013-12-19 DIAGNOSIS — G4733 Obstructive sleep apnea (adult) (pediatric): Secondary | ICD-10-CM

## 2013-12-19 NOTE — Telephone Encounter (Signed)
yes

## 2013-12-19 NOTE — Telephone Encounter (Signed)
[facility] wants a new rx for pts CPAP Supplies  Pt is scheduled to see us in office on 3/23  Ok to give new rx?

## 2013-12-19 NOTE — Telephone Encounter (Signed)
[facility]

## 2013-12-27 ENCOUNTER — Ambulatory Visit: Payer: Medicare Other | Admitting: Cardiology

## 2013-12-28 ENCOUNTER — Ambulatory Visit: Payer: Medicare Other | Admitting: Cardiology

## 2014-01-03 ENCOUNTER — Encounter (INDEPENDENT_AMBULATORY_CARE_PROVIDER_SITE_OTHER): Payer: Self-pay

## 2014-01-03 ENCOUNTER — Ambulatory Visit (INDEPENDENT_AMBULATORY_CARE_PROVIDER_SITE_OTHER): Payer: BC Managed Care – PPO | Admitting: Physician Assistant

## 2014-01-03 VITALS — BP 126/80 | Temp 98.3°F | Resp 16 | Ht 71.0 in | Wt 290.8 lb

## 2014-01-03 DIAGNOSIS — Z4651 Encounter for fitting and adjustment of gastric lap band: Secondary | ICD-10-CM | POA: Diagnosis not present

## 2014-01-03 NOTE — Patient Instructions (Signed)

## 2014-01-03 NOTE — Progress Notes (Signed)
  HISTORY: Joel [name] is a 48 y.o.male who received an AP-Large lap-band in August 2014 by Dr. Daphine DeutscherMartin. He has lost an additional 5 lbs since his last visit one month ago. He has lost a total of 101 lbs since surgery. I congratulated him on his progress. He reports still feeling no real 'fullness', but does get a burning sensation if he eats too much. He is watching his overall calorie intake and his daily exercise continues at 2.5 miles in 45 minutes. He is eager to increase this. He is also eager to get a new RLE prosthesis as the current one is far too heavy. He has no complaints of regurgitation or reflux. He would like a fill today to keep his progress moving.  VITAL SIGNS: Filed Vitals:   01/03/14 0840  BP: 126/80  Temp: 98.3 F (36.8 C)  Resp: 16    PHYSICAL EXAM: Physical exam reveals a very well-appearing 47 y.o.male in no apparent distress Neurologic: Awake, alert, oriented Psych: Bright affect, conversant Respiratory: Breathing even and unlabored. No stridor or wheezing Abdomen: Soft, nontender, nondistended to palpation. Incisions well-healed. No incisional hernias. Port easily palpated. Extremities: Atraumatic, good range of motion.  ASSESMENT: 48 y.o.  male  s/p AP-large lap-band.   PLAN: The patient's port was accessed with a 20G Huber needle without difficulty. Clear fluid was aspirated and 0.5 mL saline was added to the port. The patient was able to swallow water without difficulty following the procedure and was instructed to take clear liquids for the next 24-48 hours and advance slowly as tolerated.

## 2014-01-17 ENCOUNTER — Ambulatory Visit: Payer: Medicare Other | Admitting: Cardiology

## 2014-01-18 ENCOUNTER — Encounter: Payer: Self-pay | Admitting: General Surgery

## 2014-01-18 DIAGNOSIS — I10 Essential (primary) hypertension: Secondary | ICD-10-CM

## 2014-01-22 ENCOUNTER — Encounter: Payer: Self-pay | Admitting: Cardiology

## 2014-01-23 ENCOUNTER — Encounter: Payer: Self-pay | Admitting: Cardiology

## 2014-01-23 ENCOUNTER — Ambulatory Visit: Payer: Medicare Other | Admitting: Cardiology

## 2014-01-23 ENCOUNTER — Ambulatory Visit (INDEPENDENT_AMBULATORY_CARE_PROVIDER_SITE_OTHER): Payer: Medicare Other | Admitting: Cardiology

## 2014-01-23 VITALS — BP 140/86 | HR 51 | Ht 71.0 in | Wt 282.0 lb

## 2014-01-23 DIAGNOSIS — I10 Essential (primary) hypertension: Secondary | ICD-10-CM | POA: Diagnosis not present

## 2014-01-23 DIAGNOSIS — G4733 Obstructive sleep apnea (adult) (pediatric): Secondary | ICD-10-CM | POA: Diagnosis not present

## 2014-01-23 DIAGNOSIS — Z9989 Dependence on other enabling machines and devices: Principal | ICD-10-CM

## 2014-01-23 NOTE — Patient Instructions (Addendum)
Your physician recommends that you continue on your current medications as directed. Please refer to the Current Medication list given to you today.  Your physician has recommended that you have a sleep study. This test records several body functions during sleep, including: brain activity, eye movement, oxygen and carbon dioxide blood levels, heart rate and rhythm, breathing rate and rhythm, the flow of air through your mouth and nose, snoring, body muscle movements, and chest and belly movement. ([facility])   Your physician wants you to follow-up in: 6 months with Dr Sherlyn Lickurner You will receive a reminder letter in the mail two months in advance. If you don't receive a letter, please call our office to schedule the follow-up appointment.

## 2014-01-23 NOTE — Progress Notes (Signed)
[address] [place], [place]  [address] Phone: [phone]) [phone] Fax:  [phone]  Date:  01/23/2014   ID:  Joel [name], DOB 1965-11-15, MRN [id number]  PCP:  Johny BlamerHARRIS, WILLIAM, MD  Sleep medicine:  Armanda Magicraci Turner, MD     History of Present Illness: Joel [name] is a 48 y.o. male with a history of OSA on BiPAP ASV, HTN and obesity who presents today for followup.  He is doing well.  He tolerates his device without any problems.  He feels the pressure is adequate and tolerates the mask.  He feels rested in the am and has no daytime sleepiness.  He had lap band surgery last August and has lost 145 pounds with diet and exercise.   Wt Readings from Last 3 Encounters:  01/23/14 282 lb (127.914 kg)  01/03/14 290 lb 12.8 oz (131.906 kg)  12/06/13 295 lb (133.811 kg)     Past Medical History  Diagnosis Date  . Morbid obesity   . Prediabetes     Patient reported on 02/01/13  . Heartburn   . OSA on CPAP     SEVERE  . Hypertension     [facility] 09-2012  . Insomnia   . Chronic constipation   . Diabetes mellitus without complication     Type II    Current Outpatient Prescriptions  Medication Sig Dispense Refill  . aspirin 325 MG tablet Take 325 mg by mouth at bedtime.       Marland Kitchen. losartan-hydrochlorothiazide (HYZAAR) 100-25 MG per tablet Take 1 tablet by mouth every morning.       . methocarbamol (ROBAXIN) 750 MG tablet Take 750 mg by mouth 3 (three) times daily as needed. Pain      . oxyCODONE (OXY IR/ROXICODONE) 5 MG immediate release tablet       . ranitidine (ZANTAC) 75 MG tablet Take 75 mg by mouth daily.      Marland Kitchen. zolpidem (AMBIEN) 10 MG tablet Take 10 mg by mouth at bedtime as needed for sleep.        No current facility-administered medications for this visit.    Allergies:   No Known Allergies  Social History:  The patient  reports that he quit smoking about 15 years ago. His smoking use included Cigarettes. He has a 44 pack-year smoking history. He has never used  smokeless tobacco. He reports that he drinks alcohol. He reports that he does not use illicit drugs.   Family History:  The patient's family history includes Heart murmur in his sister.   ROS:  Please see the history of present illness.      All other systems reviewed and negative.   PHYSICAL EXAM: VS:  BP 140/86  Pulse 51  Ht 5\' 11"  (1.803 m)  Wt 282 lb (127.914 kg)  BMI 39.35 kg/m2 Well nourished, well developed, in no acute distress HEENT: normal Neck: no JVD Cardiac:  normal S1, S2; RRR; no murmur Lungs:  clear to auscultation bilaterally, no wheezing, rhonchi or rales Abd: soft, nontender, no hepatomegaly Ext: no edema Skin: warm and dry Neuro:  CNs 2-12 intact, no focal abnormalities noted       ASSESSMENT AND PLAN:  1. OSA on BiPAP ASV and tolerating well.  His download today showed an AHI of 1.6/hr and 50% compliance in using more than 4 hours nightly.  I encouraged him to try to be more compliant.  He has lost 145 pounds since August after a lap band and  running 3 miles daily.  He runs 15 miles weekly.  I have recommended that we repeat a split night PSG and see if we can get him off BiPAP ASV and onto CPAP.   2. Obesity -He has lost 145 pounds since August after a lap band and running 3 miles daily.  He runs 15 miles weekly.  I congratulated on his weight loss. 3. HTN - well controlled - continue Hyzaar  Followup with me in 6 months  Signed, Armanda Magicraci Turner, MD 01/23/2014 9:12 AM

## 2014-02-05 ENCOUNTER — Encounter: Payer: Self-pay | Admitting: Cardiology

## 2014-02-05 DIAGNOSIS — G4733 Obstructive sleep apnea (adult) (pediatric): Secondary | ICD-10-CM | POA: Diagnosis not present

## 2014-02-07 ENCOUNTER — Encounter (INDEPENDENT_AMBULATORY_CARE_PROVIDER_SITE_OTHER): Payer: Self-pay | Admitting: Physician Assistant

## 2014-02-07 ENCOUNTER — Ambulatory Visit (INDEPENDENT_AMBULATORY_CARE_PROVIDER_SITE_OTHER): Payer: BC Managed Care – PPO | Admitting: Physician Assistant

## 2014-02-07 VITALS — BP 130/78 | HR 76 | Temp 97.3°F | Resp 16 | Ht 71.0 in | Wt 277.6 lb

## 2014-02-07 DIAGNOSIS — Z9884 Bariatric surgery status: Secondary | ICD-10-CM

## 2014-02-07 NOTE — Progress Notes (Signed)
  HISTORY: Joel [name] is a 48 y.o.male who received an AP-Large lap-band in August 2014 by Dr. Daphine DeutscherMartin. He comes in today with a respectable 13 lb weight loss since his last visit one month ago. He has lost 114 lbs since his surgery. His BMI is now below 40 - a real landmark for him. He reports having good control over his hunger. His portion sizes remain small as well. He had one episode of regurgitation when trying a hot dog, which he knew had given him problems in the past. Other than that, he's had no regurgitation or reflux. He is walking 3 miles a day on his treadmill now. He underwent a sleep study two nights ago and is awaiting the results in the hopes to discontinue his BiPAP usage.  VITAL SIGNS: Filed Vitals:   02/07/14 0840  BP: 130/78  Pulse: 76  Temp: 97.3 F (36.3 C)  Resp: 16    PHYSICAL EXAM: Physical exam reveals a very well-appearing 47 y.o.male in no apparent distress Neurologic: Awake, alert, oriented Psych: Bright affect, conversant Respiratory: Breathing even and unlabored. No stridor or wheezing Extremities: Atraumatic, good range of motion. Skin: Warm, Dry, no rashes Musculoskeletal: Normal gait, Joints normal  ASSESMENT: 48 y.o.  male  s/p AP-Large lap-band.   PLAN: As he's clearly in the green zone, we deferred a fill today. I congratulated him on his continued progress. He will continue his current exercise and dietary regime. If he experiences increased hunger or portion sizes, I asked him to contact us.

## 2014-02-07 NOTE — Patient Instructions (Signed)
Return in one month. Focus on good food choices as well as physical activity. Return sooner if you have an increase in hunger, portion sizes or weight. Return also for difficulty swallowing, night cough, reflux.   

## 2014-02-12 ENCOUNTER — Telehealth: Payer: Self-pay | Admitting: Cardiology

## 2014-02-12 NOTE — Telephone Encounter (Signed)
Please let patient know that he has severe OSA with AHI 43/hr and set up CPAP titration at sleep lab

## 2014-02-13 DIAGNOSIS — E119 Type 2 diabetes mellitus without complications: Secondary | ICD-10-CM | POA: Diagnosis not present

## 2014-02-13 DIAGNOSIS — M549 Dorsalgia, unspecified: Secondary | ICD-10-CM | POA: Diagnosis not present

## 2014-02-13 DIAGNOSIS — I10 Essential (primary) hypertension: Secondary | ICD-10-CM | POA: Diagnosis not present

## 2014-02-13 DIAGNOSIS — G4700 Insomnia, unspecified: Secondary | ICD-10-CM | POA: Diagnosis not present

## 2014-02-13 NOTE — Telephone Encounter (Signed)
To Danielle.  

## 2014-02-15 ENCOUNTER — Telehealth: Payer: Self-pay | Admitting: Cardiology

## 2014-02-15 NOTE — Telephone Encounter (Signed)
Pt already has a CPAP at home and this sleep study was ordered due to pts great weight loss. He asked if he needed the titration at sleep lab or if he should do the two week auto-titration.

## 2014-02-15 NOTE — Telephone Encounter (Signed)
Since he has had significant weight loss I would like CPAP titration done in the lab

## 2014-02-15 NOTE — Telephone Encounter (Signed)
Pt is aware. I filled out titration form and gave to girls up front to fax over to [facility] [facility].

## 2014-02-15 NOTE — Telephone Encounter (Signed)
New message     Pt said he is returning Danielle's call

## 2014-02-15 NOTE — Telephone Encounter (Signed)
Pt refuses another overnight study, Pt stated he was not having any problems w his machine. He stated he had a very uncomfortable visit and he refuses to go back. He said he will do anything else but he will not go back to the sleep center. To Dr Mayford Knifeurner to advise.

## 2014-02-16 NOTE — Telephone Encounter (Signed)
Please order a 2 week autotitration on CPAP from 4 to 20cm H2O

## 2014-02-18 ENCOUNTER — Other Ambulatory Visit: Payer: Self-pay | Admitting: General Surgery

## 2014-02-18 DIAGNOSIS — G4733 Obstructive sleep apnea (adult) (pediatric): Secondary | ICD-10-CM

## 2014-02-18 NOTE — Telephone Encounter (Signed)
Order put in epic, and [facility]. Will call pt once we open to make aware.

## 2014-02-18 NOTE — Telephone Encounter (Signed)
Pt is aware.  

## 2014-02-20 ENCOUNTER — Encounter: Payer: Self-pay | Admitting: Cardiology

## 2014-02-28 ENCOUNTER — Other Ambulatory Visit: Payer: Self-pay | Admitting: Cardiology

## 2014-02-28 DIAGNOSIS — G4733 Obstructive sleep apnea (adult) (pediatric): Secondary | ICD-10-CM

## 2014-03-07 ENCOUNTER — Encounter (INDEPENDENT_AMBULATORY_CARE_PROVIDER_SITE_OTHER): Payer: Self-pay

## 2014-03-07 ENCOUNTER — Ambulatory Visit (INDEPENDENT_AMBULATORY_CARE_PROVIDER_SITE_OTHER): Payer: BLUE CROSS/BLUE SHIELD | Admitting: Physician Assistant

## 2014-03-07 ENCOUNTER — Encounter (INDEPENDENT_AMBULATORY_CARE_PROVIDER_SITE_OTHER): Payer: BLUE CROSS/BLUE SHIELD

## 2014-03-07 VITALS — BP 150/84 | HR 65 | Temp 98.2°F | Ht 71.0 in | Wt 266.8 lb

## 2014-03-07 DIAGNOSIS — Z4651 Encounter for fitting and adjustment of gastric lap band: Secondary | ICD-10-CM

## 2014-03-07 NOTE — Patient Instructions (Signed)

## 2014-03-07 NOTE — Progress Notes (Signed)
  HISTORY: Joel [name] is a 48 y.o.male who received an AP-Large lap-band in August 2014 by Dr. Daphine Deutscher. He comes in with 11 lbs weight loss since his last visit one month ago. His total weight loss since surgery is 125 lbs. He has no new complaints. He's increased his exercise gradually. He has noticed a slight increase in his portion sizes. No history of persistent regurgitation or reflux (he had 2 incidents of food getting stuck which self-resolved). He thinks a small fill today may be helpful. He wants to lose at least 20 more lbs before his one year anniversary.  VITAL SIGNS: Filed Vitals:   03/07/14 0952  BP: 150/84  Pulse: 65  Temp: 98.2 F (36.8 C)    PHYSICAL EXAM: Physical exam reveals a very well-appearing 47 y.o.male in no apparent distress Neurologic: Awake, alert, oriented Psych: Bright affect, conversant Respiratory: Breathing even and unlabored. No stridor or wheezing Abdomen: Soft, nontender, nondistended to palpation. Incisions well-healed. No incisional hernias. Port easily palpated. Extremities: Atraumatic, good range of motion.  ASSESMENT: 48 y.o.  male  s/p AP-Large lap-band.   PLAN: The patient's port was accessed with a 20G Huber needle without difficulty. Clear fluid was aspirated and 0.25 mL saline was added to the port. The patient was able to swallow water without difficulty following the procedure and was instructed to take clear liquids for the next 24-48 hours and advance slowly as tolerated.

## 2014-03-20 ENCOUNTER — Encounter: Payer: Self-pay | Admitting: Cardiology

## 2014-04-04 ENCOUNTER — Encounter (INDEPENDENT_AMBULATORY_CARE_PROVIDER_SITE_OTHER): Payer: Self-pay

## 2014-04-04 ENCOUNTER — Ambulatory Visit (INDEPENDENT_AMBULATORY_CARE_PROVIDER_SITE_OTHER): Payer: BC Managed Care – PPO | Admitting: Physician Assistant

## 2014-04-04 VITALS — BP 158/100 | HR 80 | Temp 98.0°F | Resp 14 | Ht 71.0 in | Wt 265.6 lb

## 2014-04-04 DIAGNOSIS — Z9884 Bariatric surgery status: Secondary | ICD-10-CM | POA: Diagnosis not present

## 2014-04-04 NOTE — Patient Instructions (Signed)
Return in one month. Focus on good food choices as well as physical activity. Return sooner if you have an increase in hunger, portion sizes or weight. Return also for difficulty swallowing, night cough, reflux.   

## 2014-04-04 NOTE — Progress Notes (Signed)
  HISTORY: Joel [name] [name] is a 48 y.o.male who received an AP-Large lap-band in August 2014 by Dr. Daphine DeutscherMartin. He comes in with 1 lb weight loss since his last visit a month ago. He has lost a total of 126 lbs since surgery. He was on vacation last week and had less physical activity than normal and was a bit more liberal with his diet. He claims a 5 lb weight gain but he managed to get this off before coming in today. He says his hunger is in very good control and his portion sizes remain small. He had one episode of regurgitation that he attributes to inadequate chewing. He continues to increase his exercise steadily - he ran 3 miles a day since 3 days ago. He doesn't feel a fill is in order.  VITAL SIGNS: Filed Vitals:   04/04/14 0845  BP: 158/100  Pulse: 80  Temp: 98 F (36.7 C)  Resp: 14    PHYSICAL EXAM: Physical exam reveals a very well-appearing 47 y.o.male in no apparent distress Neurologic: Awake, alert, oriented Psych: Bright affect, conversant Respiratory: Breathing even and unlabored. No stridor or wheezing Extremities: Atraumatic, good range of motion. Skin: Warm, Dry, no rashes Musculoskeletal: Normal gait, Joints normal  ASSESMENT: 48 y.o.  male  s/p AP-Large lap-band.   PLAN: We deferred a fill today as he appears to be in the green zone. We'll have him back in one month for his one year anniversary appointment. I encouraged him to continue his exercise regime and to contact us should he have any difficulties with his band.

## 2014-04-11 DIAGNOSIS — S88119A Complete traumatic amputation at level between knee and ankle, unspecified lower leg, initial encounter: Secondary | ICD-10-CM | POA: Diagnosis not present

## 2014-05-09 ENCOUNTER — Ambulatory Visit (INDEPENDENT_AMBULATORY_CARE_PROVIDER_SITE_OTHER): Payer: BLUE CROSS/BLUE SHIELD | Admitting: Physician Assistant

## 2014-05-09 ENCOUNTER — Encounter (INDEPENDENT_AMBULATORY_CARE_PROVIDER_SITE_OTHER): Payer: Self-pay

## 2014-05-09 DIAGNOSIS — Z4651 Encounter for fitting and adjustment of gastric lap band: Secondary | ICD-10-CM

## 2014-05-09 NOTE — Progress Notes (Signed)
  HISTORY: Joel [name] is a 48 y.o.male who received an AP-Large lap-band in August 2014 by Dr. Daphine DeutscherMartin. The patient has lost 10 lbs since their last visit in July, and has lost 135 lbs since surgery. He comes in today with his wife. He had a GI illness over the last month yielding nausea/vomiting for a few days. This self-resolved. He has noticed a slight increase in his hunger and portion sizes. He has had 2 episodes of regurgitation, one of which when he experimented with bread. Otherwise he has no issues with swallowing. He continues to have a regular exercise routine, with running/walking 3 miles a day. He would like a fill today.  VITAL SIGNS: Filed Vitals:   05/09/14 0828  BP: 142/90  Pulse: 72  Temp: 98 F (36.7 C)  Resp: 14    PHYSICAL EXAM: Physical exam reveals a very well-appearing 47 y.o.male in no apparent distress Neurologic: Awake, alert, oriented Psych: Bright affect, conversant Respiratory: Breathing even and unlabored. No stridor or wheezing Abdomen: Soft, nontender, nondistended to palpation. Incisions well-healed. No incisional hernias. Port easily palpated. Extremities: Atraumatic, good range of motion.  ASSESMENT: 48 y.o.  male  s/p AP-Large lap-band.   PLAN: The patient's port was accessed with a 20G Huber needle without difficulty. Clear fluid was aspirated and 0.25 mL saline was added to the port. The patient was able to swallow water without difficulty following the procedure and was instructed to take clear liquids for the next 24-48 hours and advance slowly as tolerated. I congratulated him on his progress. His BMI has dropped by 20 points in one year. I encouraged him to continue exercise and to watch his eating habits. We'll have him back in three months or sooner if needed. We obtained a one-year anniversary photograph today.

## 2014-05-09 NOTE — Patient Instructions (Signed)

## 2014-05-16 DIAGNOSIS — M793 Panniculitis, unspecified: Secondary | ICD-10-CM | POA: Diagnosis not present

## 2014-05-16 DIAGNOSIS — L299 Pruritus, unspecified: Secondary | ICD-10-CM | POA: Diagnosis not present

## 2014-06-07 ENCOUNTER — Encounter: Payer: Self-pay | Admitting: Cardiology

## 2014-07-19 ENCOUNTER — Other Ambulatory Visit: Payer: Self-pay

## 2014-08-08 ENCOUNTER — Encounter (INDEPENDENT_AMBULATORY_CARE_PROVIDER_SITE_OTHER): Payer: BLUE CROSS/BLUE SHIELD

## 2014-10-08 ENCOUNTER — Encounter (HOSPITAL_COMMUNITY): Payer: Self-pay | Admitting: Emergency Medicine

## 2014-10-08 ENCOUNTER — Emergency Department (HOSPITAL_COMMUNITY): Payer: BLUE CROSS/BLUE SHIELD

## 2014-10-08 ENCOUNTER — Observation Stay (HOSPITAL_COMMUNITY)
Admission: EM | Admit: 2014-10-08 | Discharge: 2014-10-09 | Disposition: A | Discharge status: Home / Self Care | Payer: BLUE CROSS/BLUE SHIELD | Attending: General Surgery | Admitting: General Surgery

## 2014-10-08 DIAGNOSIS — M549 Dorsalgia, unspecified: Secondary | ICD-10-CM | POA: Insufficient documentation

## 2014-10-08 DIAGNOSIS — K429 Umbilical hernia without obstruction or gangrene: Secondary | ICD-10-CM | POA: Diagnosis not present

## 2014-10-08 DIAGNOSIS — R1033 Periumbilical pain: Secondary | ICD-10-CM | POA: Diagnosis present

## 2014-10-08 DIAGNOSIS — G8929 Other chronic pain: Secondary | ICD-10-CM | POA: Insufficient documentation

## 2014-10-08 DIAGNOSIS — Z79899 Other long term (current) drug therapy: Secondary | ICD-10-CM | POA: Insufficient documentation

## 2014-10-08 DIAGNOSIS — G4733 Obstructive sleep apnea (adult) (pediatric): Secondary | ICD-10-CM | POA: Diagnosis not present

## 2014-10-08 DIAGNOSIS — I10 Essential (primary) hypertension: Secondary | ICD-10-CM | POA: Diagnosis not present

## 2014-10-08 DIAGNOSIS — Z9884 Bariatric surgery status: Secondary | ICD-10-CM | POA: Diagnosis not present

## 2014-10-08 DIAGNOSIS — K420 Umbilical hernia with obstruction, without gangrene: Secondary | ICD-10-CM

## 2014-10-08 DIAGNOSIS — Z87891 Personal history of nicotine dependence: Secondary | ICD-10-CM | POA: Diagnosis not present

## 2014-10-08 DIAGNOSIS — G4700 Insomnia, unspecified: Secondary | ICD-10-CM | POA: Insufficient documentation

## 2014-10-08 DIAGNOSIS — E119 Type 2 diabetes mellitus without complications: Secondary | ICD-10-CM | POA: Insufficient documentation

## 2014-10-08 DIAGNOSIS — K439 Ventral hernia without obstruction or gangrene: Principal | ICD-10-CM | POA: Insufficient documentation

## 2014-10-08 DIAGNOSIS — K436 Other and unspecified ventral hernia with obstruction, without gangrene: Secondary | ICD-10-CM | POA: Diagnosis not present

## 2014-10-08 DIAGNOSIS — K460 Unspecified abdominal hernia with obstruction, without gangrene: Secondary | ICD-10-CM | POA: Diagnosis present

## 2014-10-08 DIAGNOSIS — K598 Other specified functional intestinal disorders: Secondary | ICD-10-CM | POA: Diagnosis not present

## 2014-10-08 LAB — COMPREHENSIVE METABOLIC PANEL
ALT: 27 U/L (ref 0–53)
AST: 36 U/L (ref 0–37)
Albumin: 4.3 g/dL (ref 3.5–5.2)
Alkaline Phosphatase: 83 U/L (ref 39–117)
Anion gap: 7 (ref 5–15)
BUN: 18 mg/dL (ref 6–23)
CO2: 27 mmol/L (ref 19–32)
CREATININE: 1.02 mg/dL (ref 0.50–1.35)
Calcium: 9.5 mg/dL (ref 8.4–10.5)
Chloride: 106 mEq/L (ref 96–112)
GFR, EST NON AFRICAN AMERICAN: 85 mL/min — AB (ref >90)
GLUCOSE: 93 mg/dL (ref 70–99)
Potassium: 3.9 mmol/L (ref 3.5–5.1)
SODIUM: 140 mmol/L (ref 135–145)
TOTAL PROTEIN: 7.3 g/dL (ref 6.0–8.3)
Total Bilirubin: 1.1 mg/dL (ref 0.3–1.2)

## 2014-10-08 LAB — URINALYSIS, ROUTINE W REFLEX MICROSCOPIC
Bilirubin Urine: NEGATIVE
GLUCOSE, UA: NEGATIVE mg/dL
Ketones, ur: 40 mg/dL — AB
Leukocytes, UA: NEGATIVE
Nitrite: NEGATIVE
PROTEIN: NEGATIVE mg/dL
Specific Gravity, Urine: 1.025 (ref 1.005–1.030)
Urobilinogen, UA: 0.2 mg/dL (ref 0.0–1.0)
pH: 5 (ref 5.0–8.0)

## 2014-10-08 LAB — URINE MICROSCOPIC-ADD ON

## 2014-10-08 LAB — CBC WITH DIFFERENTIAL/PLATELET
Basophils Absolute: 0 10*3/uL (ref 0.0–0.1)
Basophils Relative: 1 % (ref 0–1)
EOS PCT: 2 % (ref 0–5)
Eosinophils Absolute: 0.1 10*3/uL (ref 0.0–0.7)
HCT: 42.1 % (ref 39.0–52.0)
HEMOGLOBIN: 14.2 g/dL (ref 13.0–17.0)
LYMPHS ABS: 1.3 10*3/uL (ref 0.7–4.0)
LYMPHS PCT: 20 % (ref 12–46)
MCH: 29 pg (ref 26.0–34.0)
MCHC: 33.7 g/dL (ref 30.0–36.0)
MCV: 85.9 fL (ref 78.0–100.0)
MONOS PCT: 11 % (ref 3–12)
Monocytes Absolute: 0.7 10*3/uL (ref 0.1–1.0)
NEUTROS PCT: 68 % (ref 43–77)
Neutro Abs: 4.4 10*3/uL (ref 1.7–7.7)
PLATELETS: 183 10*3/uL (ref 150–400)
RBC: 4.9 MIL/uL (ref 4.22–5.81)
RDW: 13.8 % (ref 11.5–15.5)
WBC: 6.5 10*3/uL (ref 4.0–10.5)

## 2014-10-08 LAB — LIPASE, BLOOD: Lipase: 21 U/L (ref 11–59)

## 2014-10-08 MED ORDER — ONDANSETRON HCL 4 MG/2ML IJ SOLN
4.0000 mg | Freq: Once | INTRAMUSCULAR | Status: AC
  Administered 2014-10-08: 4 mg via INTRAVENOUS
  Filled 2014-10-08: qty 2

## 2014-10-08 MED ORDER — IOHEXOL 300 MG/ML  SOLN
50.0000 mL | Freq: Once | INTRAMUSCULAR | Status: AC | PRN
  Administered 2014-10-08: 50 mL via ORAL

## 2014-10-08 MED ORDER — SODIUM CHLORIDE 0.9 % IJ SOLN
INTRAMUSCULAR | Status: AC
  Administered 2014-10-08: 21:00:00
  Filled 2014-10-08: qty 30

## 2014-10-08 MED ORDER — SODIUM CHLORIDE 0.9 % IV SOLN
1000.0000 mL | Freq: Once | INTRAVENOUS | Status: AC
  Administered 2014-10-08: 1000 mL via INTRAVENOUS

## 2014-10-08 MED ORDER — HYDROMORPHONE HCL 1 MG/ML IJ SOLN
1.0000 mg | INTRAMUSCULAR | Status: DC | PRN
  Administered 2014-10-08: 1 mg via INTRAVENOUS
  Filled 2014-10-08 (×2): qty 1

## 2014-10-08 MED ORDER — SODIUM CHLORIDE 0.9 % IV SOLN: 1000.0000 mL | INTRAVENOUS | Status: DC

## 2014-10-08 MED ORDER — LACTATED RINGERS IV SOLN
INTRAVENOUS | Status: DC
  Administered 2014-10-09: 1000 mL via INTRAVENOUS
  Administered 2014-10-09: 11:00:00 via INTRAVENOUS

## 2014-10-08 MED ORDER — SODIUM CHLORIDE 0.9 % IJ SOLN
INTRAMUSCULAR | Status: AC
Start: 2014-10-08 — End: 2014-10-08
  Administered 2014-10-08: 21:00:00
  Filled 2014-10-08: qty 500

## 2014-10-08 MED ORDER — IOHEXOL 300 MG/ML  SOLN
100.0000 mL | Freq: Once | INTRAMUSCULAR | Status: AC | PRN
  Administered 2014-10-08: 100 mL via INTRAVENOUS

## 2014-10-08 NOTE — ED Provider Notes (Addendum)
CSN: [id number]     Arrival date & time 10/08/14  1857 History   First MD Initiated Contact with Patient 10/08/14 1928     Chief Complaint  Patient presents with  . Abdominal Pain   HPI Patient presents to the emergency room with complaints of acute abdominal pain in the periumbilical region that started this evening. Patient was walking up steps when he felt a sharp pain in the abdomen. He also felt the lump. He thought it might of been a pulled muscle suite tried taking some pain medications and a muscle relaxant. Take oxycodone for chronic back pain and that medication did not help. He has not had any trouble with nausea vomiting. No urinary symptoms. No fevers or chills. Does have a history of prior laparoscopic gastric banding surgery in 2014. Past Medical History  Diagnosis Date  . Morbid obesity   . Prediabetes     Patient reported on 02/01/13  . Heartburn   . OSA on CPAP     SEVERE  . Hypertension     [facility] 09-2012  . Insomnia   . Chronic constipation   . Diabetes mellitus without complication     Type II   Past Surgical History  Procedure Laterality Date  . Hand surgery Right 2001  . Foot Bilateral 2009  . Foot ampulated Right 2011  . Back surgery  2007    fusion- lumbar  . Laparoscopic gastric banding N/A 05/14/2013    Procedure: LAPAROSCOPIC GASTRIC BANDING;  Surgeon: Valarie MerinoMatthew B Martin, MD;  Location: [facility];  Service: General;  Laterality: N/A;  . Mesh applied to lap port  05/14/2013    Procedure: MESH APPLIED TO LAP PORT;  Surgeon: Valarie MerinoMatthew B Martin, MD;  Location: [facility];  Service: General;;   Family History  Problem Relation Age of Onset  . Heart murmur Sister    History  Substance Use Topics  . Smoking status: Former Smoker -- 2.00 packs/day for 22 years    Types: Cigarettes    Quit date: 05/09/1998  . Smokeless tobacco: Never Used  . Alcohol Use: Yes     Comment: rare    Review of Systems  All other systems reviewed and are negative.     Allergies   Review of patient's allergies indicates no known allergies.  Home Medications   Prior to Admission medications   Medication Sig Start Date End Date Taking? Authorizing Provider  aspirin 325 MG tablet Take 325 mg by mouth at bedtime.    Yes Historical Provider, MD  losartan (COZAAR) 50 MG tablet Take 50 mg by mouth every morning.   Yes Historical Provider, MD  methocarbamol (ROBAXIN) 750 MG tablet Take 750 mg by mouth 3 (three) times daily as needed. Pain 10/29/12  Yes Historical Provider, MD  Multiple Vitamin (MULTIVITAMIN WITH MINERALS) TABS tablet Take 1 tablet by mouth daily.   Yes Historical Provider, MD  oxyCODONE (OXY IR/ROXICODONE) 5 MG immediate release tablet Take 15-20 mg by mouth every 6 (six) hours as needed for moderate pain or severe pain.  06/21/13  Yes Historical Provider, MD  ranitidine (ZANTAC) 75 MG tablet Take 75 mg by mouth every morning.    Yes Historical Provider, MD  zolpidem (AMBIEN) 10 MG tablet Take 10 mg by mouth at bedtime as needed for sleep.  12/14/12  Yes Historical Provider, MD   BP 149/90 mmHg  Pulse 73  Temp(Src) 97.8 F (36.6 C) (Oral)  Resp 19  Ht 5\' 11"  (1.803 m)  Wt  238 lb (107.956 kg)  BMI 33.21 kg/m2  SpO2 100% Physical Exam  Constitutional: He appears well-developed and well-nourished. No distress.  HENT:  Head: Normocephalic and atraumatic.  Right Ear: External ear normal.  Left Ear: External ear normal.  Eyes: Conjunctivae are normal. Right eye exhibits no discharge. Left eye exhibits no discharge. No scleral icterus.  Neck: Neck supple. No tracheal deviation present.  Cardiovascular: Normal rate, regular rhythm and intact distal pulses.   Pulmonary/Chest: Effort normal and breath sounds normal. No stridor. No respiratory distress. He has no wheezes. He has no rales.  Abdominal: Soft. Bowel sounds are normal. He exhibits no distension. There is tenderness. There is no rebound and no guarding. A hernia is present. Hernia confirmed positive in  the ventral area.  Patient does have a palpable mass that he attributes to his reservoir to the right of the umbilicus, patient also has another firm mass / tender superior and to the left side of the umbilicus (few cm in size0  Musculoskeletal: He exhibits no edema or tenderness.  Neurological: He is alert. He has normal strength. No cranial nerve deficit (no facial droop, extraocular movements intact, no slurred speech) or sensory deficit. He exhibits normal muscle tone. He displays no seizure activity. Coordination normal.  Skin: Skin is warm and dry. No rash noted.  Psychiatric: He has a normal mood and affect.  Nursing note and vitals reviewed.   ED Course  Procedures (including critical care time) Labs Review Labs Reviewed  COMPREHENSIVE METABOLIC PANEL - Abnormal; Notable for the following:    GFR calc non Af Amer 85 (*)    All other components within normal limits  CBC WITH DIFFERENTIAL  LIPASE, BLOOD  URINALYSIS, ROUTINE W REFLEX MICROSCOPIC    Imaging Review No results found.   EKG Interpretation None      MDM   Patient's exam is concerning for the possibility of an umbilical hernia. Unable to reduce it. May contain adipose tissue as he doesn't have any gi symptoms.  Plan on CT scan for further evaluation.    Linwood Dibbles, MD 10/08/14 2137  I attempted to reduce the hernia unsuccessfully.  Dr Lovell Sheehan, general surgery will come in to evaluate the patient.  Linwood Dibbles, MD 10/08/14 2151

## 2014-10-08 NOTE — H&P (Signed)
Joel [name] is an 49 y.o. male.   Chief Complaint: Abdominal pain with lump HPI: Patient is a 49 year old white male status post a laparoscopic band procedure in 2014 who was in his usual state of health when he was walking up some stairs and felt something pop. He developed swelling just above the umbilicus. He became nauseated and due to ongoing pain, presented to the emergency room. He was found to have an incarcerated ventral hernia with small bowel in the hernia. This was causing a small bowel obstruction. Initial attempts by the emergency room physician were unsuccessful. Surgery was consulted and the hernia was reduced at bedside.  Past Medical History  Diagnosis Date  . Morbid obesity   . Prediabetes     Patient reported on 02/01/13  . Heartburn   . OSA on CPAP     SEVERE  . Hypertension     [facility] 09-2012  . Insomnia   . Chronic constipation   . Diabetes mellitus without complication     Type II    Past Surgical History  Procedure Laterality Date  . Hand surgery Right 2001  . Foot Bilateral 2009  . Foot ampulated Right 2011  . Back surgery  2007    fusion- lumbar  . Laparoscopic gastric banding N/A 05/14/2013    Procedure: LAPAROSCOPIC GASTRIC BANDING;  Surgeon: Pedro Earls, MD;  Location: [facility];  Service: General;  Laterality: N/A;  . Mesh applied to lap port  05/14/2013    Procedure: MESH APPLIED TO LAP PORT;  Surgeon: Pedro Earls, MD;  Location: [facility];  Service: General;;    Family History  Problem Relation Age of Onset  . Heart murmur Sister    Social History:  reports that he quit smoking about 16 years ago. His smoking use included Cigarettes. He has a 44 pack-year smoking history. He has never used smokeless tobacco. He reports that he drinks alcohol. He reports that he does not use illicit drugs.  Allergies: No Known Allergies   (Not in a hospital admission)  Results for orders placed or performed during the hospital encounter of 10/08/14 (from  the past 48 hour(s))  CBC WITH DIFFERENTIAL     Status: None   Collection Time: 10/08/14  8:10 PM  Result Value Ref Range   WBC 6.5 4.0 - 10.5 K/uL   RBC 4.90 4.22 - 5.81 MIL/uL   Hemoglobin 14.2 13.0 - 17.0 g/dL   HCT 42.1 39.0 - 52.0 %   MCV 85.9 78.0 - 100.0 fL   MCH 29.0 26.0 - 34.0 pg   MCHC 33.7 30.0 - 36.0 g/dL   RDW 13.8 11.5 - 15.5 %   Platelets 183 150 - 400 K/uL   Neutrophils Relative % 68 43 - 77 %   Neutro Abs 4.4 1.7 - 7.7 K/uL   Lymphocytes Relative 20 12 - 46 %   Lymphs Abs 1.3 0.7 - 4.0 K/uL   Monocytes Relative 11 3 - 12 %   Monocytes Absolute 0.7 0.1 - 1.0 K/uL   Eosinophils Relative 2 0 - 5 %   Eosinophils Absolute 0.1 0.0 - 0.7 K/uL   Basophils Relative 1 0 - 1 %   Basophils Absolute 0.0 0.0 - 0.1 K/uL  Comprehensive metabolic panel     Status: Abnormal   Collection Time: 10/08/14  8:10 PM  Result Value Ref Range   Sodium 140 135 - 145 mmol/L    Comment: Please note change in reference range.  Potassium 3.9 3.5 - 5.1 mmol/L    Comment: Please note change in reference range.   Chloride 106 96 - 112 mEq/L   CO2 27 19 - 32 mmol/L   Glucose, Bld 93 70 - 99 mg/dL   BUN 18 6 - 23 mg/dL   Creatinine, Ser 1.02 0.50 - 1.35 mg/dL   Calcium 9.5 8.4 - 10.5 mg/dL   Total Protein 7.3 6.0 - 8.3 g/dL   Albumin 4.3 3.5 - 5.2 g/dL   AST 36 0 - 37 U/L   ALT 27 0 - 53 U/L   Alkaline Phosphatase 83 39 - 117 U/L   Total Bilirubin 1.1 0.3 - 1.2 mg/dL   GFR calc non Af Amer 85 (L) >90 mL/min   GFR calc Af Amer >90 >90 mL/min    Comment: (NOTE) The eGFR has been calculated using the CKD EPI equation. This calculation has not been validated in all clinical situations. eGFR's persistently <90 mL/min signify possible Chronic Kidney Disease.    Anion gap 7 5 - 15  Lipase, blood     Status: None   Collection Time: 10/08/14  8:10 PM  Result Value Ref Range   Lipase 21 11 - 59 U/L  Urinalysis with microscopic     Status: Abnormal   Collection Time: 10/08/14  9:53 PM   Result Value Ref Range   Color, Urine YELLOW YELLOW   APPearance CLEAR CLEAR   Specific Gravity, Urine 1.025 1.005 - 1.030   pH 5.0 5.0 - 8.0   Glucose, UA NEGATIVE NEGATIVE mg/dL   Hgb urine dipstick MODERATE (A) NEGATIVE   Bilirubin Urine NEGATIVE NEGATIVE   Ketones, ur 40 (A) NEGATIVE mg/dL   Protein, ur NEGATIVE NEGATIVE mg/dL   Urobilinogen, UA 0.2 0.0 - 1.0 mg/dL   Nitrite NEGATIVE NEGATIVE   Leukocytes, UA NEGATIVE NEGATIVE  Urine microscopic-add on     Status: None   Collection Time: 10/08/14  9:53 PM  Result Value Ref Range   Squamous Epithelial / LPF RARE RARE   WBC, UA 0-2 <3 WBC/hpf   RBC / HPF 3-6 <3 RBC/hpf   Bacteria, UA RARE RARE   Ct Abdomen Pelvis W Contrast  10/08/2014   CLINICAL DATA:  Acute periumbilical pain with focal swelling  EXAM: CT ABDOMEN AND PELVIS WITH CONTRAST  TECHNIQUE: Multidetector CT imaging of the abdomen and pelvis was performed using the standard protocol following bolus administration of intravenous contrast.  CONTRAST:  39mL OMNIPAQUE IOHEXOL 300 MG/ML SOLN, 161mL OMNIPAQUE IOHEXOL 300 MG/ML SOLN  COMPARISON:  None.  FINDINGS: Lung bases are free of acute infiltrate or sizable effusion.  The liver, gallbladder, spleen, adrenal glands and pancreas are all normal in their CT appearance. The kidneys are well visualized bilaterally and demonstrate cystic change in the lower pole of the right kidney. Normal excretion of contrast is noted bilaterally.  The appendix is not visualized although no inflammatory changes are seen. The bladder is well distended. No pelvic mass lesion or sidewall abnormality is noted.  Postsurgical changes are noted in the lumbar spine as well as a gastric lap band along the proximal stomach.  There is a small umbilical hernia identified which measures approximately 4.6 cm. A contains both an incarcerated loop of small bowel as well as a small amount of fluid. The neck of the hernia is 1.8 cm. Mild small bowel dilatation is noted  proximal to this hernia.  IMPRESSION: Umbilical hernia containing an incarcerated loop of small bowel. Small bowel dilatation  is noted proximal to this hernia.  Renal cystic change.  Postsurgical changes.   Electronically Signed   By: Inez Catalina M.D.   On: 10/08/2014 21:41    Review of Systems  Constitutional: Negative.   HENT: Negative.   Eyes: Negative.   Respiratory: Negative.   Cardiovascular: Negative.   Gastrointestinal: Positive for nausea and abdominal pain.  Genitourinary: Negative.   Musculoskeletal: Negative.   Skin: Negative.   All other systems reviewed and are negative.   Blood pressure 126/77, pulse 68, temperature 97.8 F (36.6 C), temperature source Oral, resp. rate 20, height $RemoveBe'5\' 11"'KCpRhJExn$  (1.803 m), weight 107.956 kg (238 lb), SpO2 95 %. Physical Exam  Vitals reviewed. Constitutional: He is oriented to person, place, and time. He appears well-developed and well-nourished.  HENT:  Head: Normocephalic and atraumatic.  Neck: Normal range of motion. Neck supple.  Cardiovascular: Normal rate, regular rhythm and normal heart sounds.   Respiratory: Effort normal and breath sounds normal.  GI: Soft. He exhibits no distension. There is tenderness. There is no rebound.  Tender over the supraumbilical ventral incarcerated hernia. This was reduced at bedside without difficulty.  Neurological: He is alert and oriented to person, place, and time.  Skin: Skin is warm and dry.     Assessment/Plan Impression: Incarcerated ventral hernia, reduced Plan: Patient is being brought in to the hospital for IV hydration and control his pain and nausea. He subsequently will undergo a ventral herniorrhaphy with mesh. The risks and benefits of the procedure were fully explained to the patient, who gave informed consent.  Sophira Rumler A 10/08/2014, 10:57 PM

## 2014-10-08 NOTE — ED Notes (Signed)
Pt states he was walking up steps when he felt a "knot" in his abdomen near umbilicus. Pt states he took a muscle relaxant and thought that the pain would go away but "it hasn't". Pt with previous lap band surgery. On examination, knot felt just above umbilicus.

## 2014-10-09 ENCOUNTER — Observation Stay (HOSPITAL_COMMUNITY): Payer: BLUE CROSS/BLUE SHIELD | Admitting: Anesthesiology

## 2014-10-09 ENCOUNTER — Encounter (HOSPITAL_COMMUNITY)
Admission: EM | Disposition: A | Discharge status: Home / Self Care | Payer: Self-pay | Source: Home / Self Care | Attending: General Surgery

## 2014-10-09 ENCOUNTER — Encounter (HOSPITAL_COMMUNITY): Payer: Self-pay

## 2014-10-09 DIAGNOSIS — I10 Essential (primary) hypertension: Secondary | ICD-10-CM | POA: Diagnosis not present

## 2014-10-09 DIAGNOSIS — K436 Other and unspecified ventral hernia with obstruction, without gangrene: Secondary | ICD-10-CM | POA: Diagnosis present

## 2014-10-09 DIAGNOSIS — K439 Ventral hernia without obstruction or gangrene: Secondary | ICD-10-CM | POA: Diagnosis not present

## 2014-10-09 DIAGNOSIS — G4733 Obstructive sleep apnea (adult) (pediatric): Secondary | ICD-10-CM | POA: Diagnosis not present

## 2014-10-09 HISTORY — PX: VENTRAL HERNIA REPAIR: SHX424

## 2014-10-09 HISTORY — PX: INSERTION OF MESH: SHX5868

## 2014-10-09 LAB — SURGICAL PCR SCREEN
MRSA, PCR: POSITIVE — AB
Staphylococcus aureus: POSITIVE — AB

## 2014-10-09 SURGERY — REPAIR, HERNIA, VENTRAL
Anesthesia: General | Site: Abdomen

## 2014-10-09 MED ORDER — ROCURONIUM BROMIDE 50 MG/5ML IV SOLN
INTRAVENOUS | Status: AC
  Filled 2014-10-09: qty 2

## 2014-10-09 MED ORDER — CEFAZOLIN SODIUM-DEXTROSE 2-3 GM-% IV SOLR
2.0000 g | INTRAVENOUS | Status: AC
  Administered 2014-10-09: 2 g via INTRAVENOUS
  Filled 2014-10-09 (×2): qty 50

## 2014-10-09 MED ORDER — CHLORHEXIDINE GLUCONATE 4 % EX LIQD: 1.0000 "application " | Freq: Once | CUTANEOUS | Status: DC

## 2014-10-09 MED ORDER — CLINDAMYCIN PHOSPHATE 900 MG/50ML IV SOLN: 900.0000 mg | Freq: Once | INTRAVENOUS | Status: DC

## 2014-10-09 MED ORDER — FENTANYL CITRATE 0.05 MG/ML IJ SOLN: 25.0000 ug | INTRAMUSCULAR | Status: DC | PRN

## 2014-10-09 MED ORDER — PROPOFOL 10 MG/ML IV BOLUS
INTRAVENOUS | Status: DC | PRN
  Administered 2014-10-09: 180 mg via INTRAVENOUS

## 2014-10-09 MED ORDER — ENOXAPARIN SODIUM 40 MG/0.4ML ~~LOC~~ SOLN: 40.0000 mg | SUBCUTANEOUS | Status: DC

## 2014-10-09 MED ORDER — PANTOPRAZOLE SODIUM 40 MG PO TBEC: 40.0000 mg | DELAYED_RELEASE_TABLET | Freq: Every day | ORAL | Status: DC

## 2014-10-09 MED ORDER — CEFAZOLIN SODIUM-DEXTROSE 2-3 GM-% IV SOLR
INTRAVENOUS | Status: AC
  Filled 2014-10-09: qty 50

## 2014-10-09 MED ORDER — FENTANYL CITRATE 0.05 MG/ML IJ SOLN
INTRAMUSCULAR | Status: DC | PRN
  Administered 2014-10-09: 100 ug via INTRAVENOUS
  Administered 2014-10-09 (×3): 50 ug via INTRAVENOUS

## 2014-10-09 MED ORDER — ONDANSETRON HCL 4 MG/2ML IJ SOLN: 4.0000 mg | Freq: Four times a day (QID) | INTRAMUSCULAR | Status: DC | PRN

## 2014-10-09 MED ORDER — MIDAZOLAM HCL 2 MG/2ML IJ SOLN
1.0000 mg | INTRAMUSCULAR | Status: DC | PRN
Start: 2014-10-09 — End: 2014-10-09
  Administered 2014-10-09 (×2): 2 mg via INTRAVENOUS

## 2014-10-09 MED ORDER — ACETAMINOPHEN 650 MG RE SUPP: 650.0000 mg | Freq: Four times a day (QID) | RECTAL | Status: DC | PRN

## 2014-10-09 MED ORDER — ONDANSETRON HCL 4 MG/2ML IJ SOLN
INTRAMUSCULAR | Status: AC
  Filled 2014-10-09: qty 2

## 2014-10-09 MED ORDER — POVIDONE-IODINE 10 % EX OINT
TOPICAL_OINTMENT | CUTANEOUS | Status: DC | PRN
  Administered 2014-10-09: 1 via TOPICAL

## 2014-10-09 MED ORDER — PROPOFOL 10 MG/ML IV BOLUS
INTRAVENOUS | Status: AC
  Filled 2014-10-09: qty 20

## 2014-10-09 MED ORDER — LACTATED RINGERS IV SOLN: INTRAVENOUS | Status: DC

## 2014-10-09 MED ORDER — ROCURONIUM BROMIDE 100 MG/10ML IV SOLN
INTRAVENOUS | Status: DC | PRN
  Administered 2014-10-09: 20 mg via INTRAVENOUS
  Administered 2014-10-09: 10 mg via INTRAVENOUS

## 2014-10-09 MED ORDER — LOSARTAN POTASSIUM 50 MG PO TABS
50.0000 mg | ORAL_TABLET | Freq: Every morning | ORAL | Status: DC
  Filled 2014-10-09: qty 1

## 2014-10-09 MED ORDER — HYDROMORPHONE HCL 1 MG/ML IJ SOLN: 1.0000 mg | INTRAMUSCULAR | Status: DC | PRN

## 2014-10-09 MED ORDER — NEOSTIGMINE METHYLSULFATE 10 MG/10ML IV SOLN
INTRAVENOUS | Status: DC | PRN
  Administered 2014-10-09: 4 mg via INTRAVENOUS

## 2014-10-09 MED ORDER — CLINDAMYCIN PHOSPHATE 900 MG/50ML IV SOLN
INTRAVENOUS | Status: DC | PRN
Start: 2014-10-09 — End: 2014-10-09
  Administered 2014-10-09: 900 mg via INTRAVENOUS

## 2014-10-09 MED ORDER — LORAZEPAM 2 MG/ML IJ SOLN: 1.0000 mg | INTRAMUSCULAR | Status: DC | PRN

## 2014-10-09 MED ORDER — LIDOCAINE HCL (CARDIAC) 20 MG/ML IV SOLN
INTRAVENOUS | Status: DC | PRN
  Administered 2014-10-09: 50 mg via INTRAVENOUS

## 2014-10-09 MED ORDER — ONDANSETRON HCL 4 MG/2ML IJ SOLN: 4.0000 mg | Freq: Once | INTRAMUSCULAR | Status: DC

## 2014-10-09 MED ORDER — ONDANSETRON HCL 4 MG PO TABS: 4.0000 mg | ORAL_TABLET | Freq: Four times a day (QID) | ORAL | Status: DC | PRN

## 2014-10-09 MED ORDER — KETOROLAC TROMETHAMINE 30 MG/ML IJ SOLN
30.0000 mg | Freq: Once | INTRAMUSCULAR | Status: AC
  Administered 2014-10-09: 30 mg via INTRAVENOUS

## 2014-10-09 MED ORDER — DIPHENHYDRAMINE HCL 50 MG/ML IJ SOLN: 12.5000 mg | Freq: Four times a day (QID) | INTRAMUSCULAR | Status: DC | PRN

## 2014-10-09 MED ORDER — 0.9 % SODIUM CHLORIDE (POUR BTL) OPTIME
TOPICAL | Status: DC | PRN
Start: 2014-10-09 — End: 2014-10-09
  Administered 2014-10-09: 1000 mL

## 2014-10-09 MED ORDER — DIPHENHYDRAMINE HCL 12.5 MG/5ML PO ELIX: 12.5000 mg | ORAL_SOLUTION | Freq: Four times a day (QID) | ORAL | Status: DC | PRN

## 2014-10-09 MED ORDER — BUPIVACAINE LIPOSOME 1.3 % IJ SUSP
INTRAMUSCULAR | Status: AC
  Filled 2014-10-09: qty 20

## 2014-10-09 MED ORDER — MIDAZOLAM HCL 2 MG/2ML IJ SOLN
INTRAMUSCULAR | Status: AC
Start: 2014-10-09 — End: 2014-10-09
  Filled 2014-10-09: qty 2

## 2014-10-09 MED ORDER — NEOSTIGMINE METHYLSULFATE 10 MG/10ML IV SOLN
INTRAVENOUS | Status: AC
  Filled 2014-10-09: qty 1

## 2014-10-09 MED ORDER — LACTATED RINGERS IV SOLN
INTRAVENOUS | Status: DC | PRN
  Administered 2014-10-09 (×2): via INTRAVENOUS

## 2014-10-09 MED ORDER — ZOLPIDEM TARTRATE 5 MG PO TABS: 10.0000 mg | ORAL_TABLET | Freq: Every evening | ORAL | Status: DC | PRN

## 2014-10-09 MED ORDER — FENTANYL CITRATE 0.05 MG/ML IJ SOLN
INTRAMUSCULAR | Status: AC
  Filled 2014-10-09: qty 5

## 2014-10-09 MED ORDER — ENOXAPARIN SODIUM 40 MG/0.4ML ~~LOC~~ SOLN
40.0000 mg | SUBCUTANEOUS | Status: DC
  Administered 2014-10-09: 40 mg via SUBCUTANEOUS
  Filled 2014-10-09: qty 0.4

## 2014-10-09 MED ORDER — OXYCODONE-ACETAMINOPHEN 5-325 MG PO TABS: 1.0000 | ORAL_TABLET | ORAL | Status: DC | PRN

## 2014-10-09 MED ORDER — MUPIROCIN 2 % EX OINT
TOPICAL_OINTMENT | CUTANEOUS | Status: AC
  Filled 2014-10-09: qty 22

## 2014-10-09 MED ORDER — KETOROLAC TROMETHAMINE 30 MG/ML IJ SOLN
INTRAMUSCULAR | Status: AC
  Filled 2014-10-09: qty 1

## 2014-10-09 MED ORDER — SUCCINYLCHOLINE CHLORIDE 20 MG/ML IJ SOLN
INTRAMUSCULAR | Status: DC | PRN
  Administered 2014-10-09: 120 mg via INTRAVENOUS

## 2014-10-09 MED ORDER — GLYCOPYRROLATE 0.2 MG/ML IJ SOLN
INTRAMUSCULAR | Status: DC | PRN
  Administered 2014-10-09: 0.6 mg via INTRAVENOUS

## 2014-10-09 MED ORDER — ONDANSETRON HCL 4 MG/2ML IJ SOLN
4.0000 mg | Freq: Four times a day (QID) | INTRAMUSCULAR | Status: DC | PRN
  Administered 2014-10-09: 4 mg via INTRAVENOUS

## 2014-10-09 MED ORDER — GLYCOPYRROLATE 0.2 MG/ML IJ SOLN
INTRAMUSCULAR | Status: AC
  Filled 2014-10-09: qty 2

## 2014-10-09 MED ORDER — ARTIFICIAL TEARS OP OINT
TOPICAL_OINTMENT | OPHTHALMIC | Status: DC | PRN
  Administered 2014-10-09: 1 via OPHTHALMIC

## 2014-10-09 MED ORDER — ACETAMINOPHEN 325 MG PO TABS: 650.0000 mg | ORAL_TABLET | Freq: Four times a day (QID) | ORAL | Status: DC | PRN

## 2014-10-09 MED ORDER — POVIDONE-IODINE 10 % EX OINT
TOPICAL_OINTMENT | CUTANEOUS | Status: AC
  Filled 2014-10-09: qty 1

## 2014-10-09 MED ORDER — OXYCODONE-ACETAMINOPHEN 7.5-325 MG PO TABS: 1.0000 | ORAL_TABLET | ORAL | Status: AC | PRN

## 2014-10-09 MED ORDER — MIDAZOLAM HCL 2 MG/2ML IJ SOLN
INTRAMUSCULAR | Status: AC
  Filled 2014-10-09: qty 2

## 2014-10-09 MED ORDER — CLINDAMYCIN PHOSPHATE 900 MG/50ML IV SOLN
INTRAVENOUS | Status: AC
  Filled 2014-10-09: qty 50

## 2014-10-09 MED ORDER — ARTIFICIAL TEARS OP OINT
TOPICAL_OINTMENT | OPHTHALMIC | Status: AC
  Filled 2014-10-09: qty 3.5

## 2014-10-09 MED ORDER — PANTOPRAZOLE SODIUM 40 MG IV SOLR
40.0000 mg | INTRAVENOUS | Status: DC
  Administered 2014-10-09: 40 mg via INTRAVENOUS
  Filled 2014-10-09: qty 40

## 2014-10-09 MED ORDER — CEFAZOLIN SODIUM-DEXTROSE 2-3 GM-% IV SOLR
2.0000 g | INTRAVENOUS | Status: DC
  Filled 2014-10-09: qty 50

## 2014-10-09 MED ORDER — ONDANSETRON HCL 4 MG/2ML IJ SOLN: 4.0000 mg | Freq: Once | INTRAMUSCULAR | Status: DC | PRN

## 2014-10-09 MED ORDER — BUPIVACAINE LIPOSOME 1.3 % IJ SUSP
INTRAMUSCULAR | Status: DC | PRN
  Administered 2014-10-09: 18 mL

## 2014-10-09 SURGICAL SUPPLY — 41 items
BAG HAMPER (MISCELLANEOUS) ×4 IMPLANT
BLADE 11 SAFETY STRL DISP (BLADE) IMPLANT
CHLORAPREP W/TINT 26ML (MISCELLANEOUS) ×4 IMPLANT
CLOTH BEACON ORANGE TIMEOUT ST (SAFETY) ×4 IMPLANT
COVER LIGHT HANDLE STERIS (MISCELLANEOUS) ×8 IMPLANT
ELECT REM PT RETURN 9FT ADLT (ELECTROSURGICAL) ×4
ELECTRODE REM PT RTRN 9FT ADLT (ELECTROSURGICAL) ×2 IMPLANT
FORMALIN 10 PREFIL 480ML (MISCELLANEOUS) ×4 IMPLANT
GAUZE SPONGE 4X4 12PLY STRL (GAUZE/BANDAGES/DRESSINGS) ×2 IMPLANT
GLOVE BIOGEL PI IND STRL 7.0 (GLOVE) IMPLANT
GLOVE BIOGEL PI IND STRL 7.5 (GLOVE) IMPLANT
GLOVE BIOGEL PI INDICATOR 7.0 (GLOVE) ×2
GLOVE BIOGEL PI INDICATOR 7.5 (GLOVE) ×4
GLOVE ECLIPSE 6.5 STRL STRAW (GLOVE) ×2 IMPLANT
GLOVE SURG SS PI 7.5 STRL IVOR (GLOVE) ×6 IMPLANT
GOWN STRL REUS W/ TWL XL LVL3 (GOWN DISPOSABLE) IMPLANT
GOWN STRL REUS W/TWL LRG LVL3 (GOWN DISPOSABLE) ×8 IMPLANT
GOWN STRL REUS W/TWL XL LVL3 (GOWN DISPOSABLE)
INST SET MAJOR GENERAL (KITS) ×4 IMPLANT
KIT ROOM TURNOVER APOR (KITS) ×4 IMPLANT
MANIFOLD NEPTUNE II (INSTRUMENTS) ×4 IMPLANT
MESH VENTRALEX ST 1-7/10 CRC S (Mesh General) ×2 IMPLANT
NS IRRIG 1000ML POUR BTL (IV SOLUTION) ×4 IMPLANT
PACK ABDOMINAL MAJOR (CUSTOM PROCEDURE TRAY) ×4 IMPLANT
PAD ARMBOARD 7.5X6 YLW CONV (MISCELLANEOUS) ×4 IMPLANT
SET BASIN LINEN APH (SET/KITS/TRAYS/PACK) ×4 IMPLANT
SPONGE GAUZE 4X4 12PLY (GAUZE/BANDAGES/DRESSINGS) ×2 IMPLANT
STAPLER VISISTAT (STAPLE) ×4 IMPLANT
SUT ETHIBOND NAB MO 7 #0 18IN (SUTURE) ×2 IMPLANT
SUT NOVA NAB GS-21 1 T12 (SUTURE) IMPLANT
SUT NOVA NAB GS-22 2 2-0 T-19 (SUTURE) ×2 IMPLANT
SUT NOVA NAB GS-26 0 60 (SUTURE) IMPLANT
SUT SILK 2 0 (SUTURE)
SUT SILK 2-0 18XBRD TIE 12 (SUTURE) IMPLANT
SUT VIC AB 2-0 CT2 27 (SUTURE) IMPLANT
SUT VIC AB 3-0 SH 27 (SUTURE)
SUT VIC AB 3-0 SH 27X BRD (SUTURE) IMPLANT
SUT VIC AB 4-0 PS2 27 (SUTURE) IMPLANT
SUT VICRYL AB 2 0 TIES (SUTURE) ×2 IMPLANT
SYR 20CC LL (SYRINGE) ×4 IMPLANT
TAPE CLOTH SURG 4X10 WHT LF (GAUZE/BANDAGES/DRESSINGS) ×2 IMPLANT

## 2014-10-09 NOTE — Op Note (Signed)
Patient:  Joel MaidensMichael H [name]  DOB:  03/25/1966  MRN:  [id number]   Preop Diagnosis:  Ventral hernia  Postop Diagnosis:  Same  Procedure:  Ventral herniorrhaphy with mesh  Surgeon:  Franky MachoMark Nelli Swalley, M.D.  Anes:  Gen. endotracheal  Indications:  Patient is a 49 year old white male status post a lap band procedure in the past 2 now presents with a supraumbilical hernia. He did have incarcerated small bowel, but this was reduced at bedside without difficulty. The patient now comes the operating room for ventral herniorrhaphy with mesh. The risks and benefits of the procedure including bleeding, infection, and the possibility of recurrence of the hernia were fully explained to the patient, who gave informed consent.  Procedure note:  The patient is placed the supine position. After induction of general endotracheal anesthesia, the abdomen was prepped and draped using the usual sterile technique with ChloraPrep. Surgical site confirmation was performed. It was found out during induction, that the patient was MRSA positive. He was given clindamycin at the beginning of the procedure.  A transverse supraumbilical incision was made down to the fascia. A hernia sac was found. There was a narrow neck at the fascial level with the hernia sac being well formed. A hernia sac was excised to the fascial level. A 4.3 cm ventralex ST Bard patch was then inserted and secured to the fascia using 0 Ethibond interrupted sutures. The overlying fascia was reapproximated transversely using 0 Ethibond interrupted sutures. The subcutaneous layer was reapproximated using 3-0 Vicryl interrupted sutures. The skin was closed using staples. Exparel was instilled the surrounding wound. Betadine ointment and dry sterile dressing were applied.  All tape and needle counts were correct at the end of the procedure. The patient was extubated in the operating room and transferred PACU in stable condition.  Complications:  None  EBL:   Minimal  Specimen:  None

## 2014-10-09 NOTE — Discharge Instructions (Signed)
Open Hernia Repair, Care After °Refer to this sheet in the next few weeks. These instructions provide you with information on caring for yourself after your procedure. Your health care provider may also give you more specific instructions. Your treatment has been planned according to current medical practices, but problems sometimes occur. Call your health care provider if you have any problems or questions after your procedure. °WHAT TO EXPECT AFTER THE PROCEDURE °After your procedure, it is typical to have the following: °· Pain in your abdomen, especially along your incision. You will be given pain medicines to control the pain. °· Constipation. You may be given a stool softener to help prevent this. °HOME CARE INSTRUCTIONS  °· Only take over-the-counter or prescription medicines as directed by your health care provider. °· Keep the wound dry and clean. You may wash the wound gently with soap and water 48 hours after surgery. Gently blot or dab the wound dry. Do not take baths, use swimming pools, or use hot tubs for 10 days or until your health care provider approves. °· Change bandages (dressings) as directed by your health care provider. °· Continue your normal diet as directed by your health care provider. Eat plenty of fruits and vegetables to help prevent constipation. °· Drink enough fluids to keep your urine clear or pale yellow. This also helps prevent constipation. °· Do not drive until your health care provider says it is okay. °· Do not lift anything heavier than 10 pounds (4.5 kg) or play contact sports for 4 weeks or until your health care provider approves. °· Follow up with your health care provider as directed. Ask your health care provider when to make an appointment to have your stitches (sutures) or staples removed. °SEEK MEDICAL CARE IF:  °· You have increased bleeding coming from the incision site. °· You have blood in your stool. °· You have increasing pain in the wound. °· You see redness  or swelling in the wound. °· You have fluid (pus) coming from the wound. °· You have a fever. °· You notice a bad smell coming from the wound or dressing. °SEEK IMMEDIATE MEDICAL CARE IF:  °· You develop a rash. °· You have chest pain or shortness of breath. °· You feel lightheaded or feel faint. °Document Released: 04/09/2005 Document Revised: 07/11/2013 Document Reviewed: 05/02/2013 °Exit[employer]® Patient Information ©2015 [employer]. This information is not intended to replace advice given to you by your health care provider. Make sure you discuss any questions you have with your health care provider. ° °

## 2014-10-09 NOTE — Anesthesia Postprocedure Evaluation (Signed)
  Anesthesia Post-op Note  Patient: Joel [name]  Procedure(s) Performed: Procedure(s): VENTRAL HERNIORRHAPHY WITH MESH (N/A) INSERTION OF MESH (N/A)  Patient Location: PACU  Anesthesia Type:General  Level of Consciousness: awake, alert , oriented and patient cooperative  Airway and Oxygen Therapy: Patient Spontanous Breathing and Patient connected to nasal cannula oxygen  Post-op Pain: 2 /10, mild  Post-op Assessment: Post-op Vital signs reviewed, Patient's Cardiovascular Status Stable, Respiratory Function Stable, Patent Airway, No signs of Nausea or vomiting and Pain level controlled  Post-op Vital Signs: Reviewed and stable  Last Vitals:  Filed Vitals:   10/09/14 1500  BP: 112/59  Pulse: 49  Temp:   Resp: 10    Complications: No apparent anesthesia complications

## 2014-10-09 NOTE — Progress Notes (Signed)
Patient discharged home.  IV removed WNL.  VSS and no complaints of pain.  Dressing dry and intact.  Reviewed medications and incisional care and s/s of infection.  Patient verbalizes understanding.  No questions at this time.  Assisted off unit with NT assist.  Instructed to make follow up appt on 10/22/14.

## 2014-10-09 NOTE — Anesthesia Preprocedure Evaluation (Signed)
Anesthesia Evaluation  Patient identified by MRN, date of birth, ID band Patient awake  General Assessment Comment:.  Hypertension     .  Morbid obesity     .  Prediabetes         Patient reported on 02/01/13   .  Heartburn     .  OSA on CPAP         SEVERE     Reviewed: Allergy & Precautions, H&P , NPO status , Patient's Chart, lab work & pertinent test results  Airway Mallampati: II  TM Distance: >3 FB Neck ROM: Full    Dental  (+) Dental Advisory Given, Missing, Poor Dentition Missing many teeth.:   Pulmonary sleep apnea , former smoker,  breath sounds clear to auscultation  Pulmonary exam normal       Cardiovascular Exercise Tolerance: Good hypertension, Pt. on medications negative cardio ROS  Rhythm:Regular Rate:Normal  ECG and CXR from April were normal.   Neuro/Psych negative neurological ROS  negative psych ROS   GI/Hepatic Neg liver ROS, GERD-  ,  Endo/Other  diabetesMorbid obesity  Renal/GU negative Renal ROS  negative genitourinary   Musculoskeletal negative musculoskeletal ROS (+)   Abdominal (+) + obese,   Peds negative pediatric ROS (+)  Hematology negative hematology ROS (+)   Anesthesia Other Findings   Reproductive/Obstetrics negative OB ROS                             Anesthesia Physical Anesthesia Plan  ASA: III  Anesthesia Plan: General   Post-op Pain Management:    Induction: Intravenous, Rapid sequence and Cricoid pressure planned  Airway Management Planned: Oral ETT  Additional Equipment:   Intra-op Plan:   Post-operative Plan: Extubation in OR  Informed Consent: I have reviewed the patients History and Physical, chart, labs and discussed the procedure including the risks, benefits and alternatives for the proposed anesthesia with the patient or authorized representative who has indicated his/her understanding and acceptance.     Plan Discussed  with:   Anesthesia Plan Comments:         Anesthesia Quick Evaluation

## 2014-10-09 NOTE — Transfer of Care (Signed)
Immediate Anesthesia Transfer of Care Note  Patient: Joel MaidensMichael H Augspurger  Procedure(s) Performed: Procedure(s): VENTRAL HERNIORRHAPHY WITH MESH (N/A) INSERTION OF MESH (N/A)  Patient Location: PACU  Anesthesia Type:General  Level of Consciousness: sedated and patient cooperative  Airway & Oxygen Therapy: Patient Spontanous Breathing and Patient connected to face mask oxygen  Post-op Assessment: Report given to PACU RN and Post -op Vital signs reviewed and stable  Post vital signs: Reviewed and stable  Complications: No apparent anesthesia complications

## 2014-10-09 NOTE — Care Management Note (Signed)
    Page 1 of 1   10/09/2014     10:56:58 AM CARE MANAGEMENT NOTE 10/09/2014  Patient:  Joel [name],Joel [name]   Account Number:  [id number]  Date Initiated:  10/09/2014  Documentation initiated by:  Kathyrn SheriffHILDRESS,JESSICA  Subjective/Objective Assessment:   Pt admitted as obs for incarcerated hernia and surgical repair. Pt is from home with self care. Pt has no HH services, DME's or med needs prior to admission. Pt plans to dsicharge home with self care. No CM needs.     Action/Plan:   Anticipated DC Date:  10/10/2014   Anticipated DC Plan:  HOME/SELF CARE      DC Planning Services  CM consult      Choice offered to / List presented to:             Status of service:  Completed, signed off Medicare Important Message given?   (If response is "NO", the following Medicare IM given date fields will be blank) Date Medicare IM given:   Medicare IM given by:   Date Additional Medicare IM given:   Additional Medicare IM given by:    Discharge Disposition:  HOME/SELF CARE  Per UR Regulation:    If discussed at Long Length of Stay Meetings, dates discussed:    Comments:  10/09/2014 1000 Kathyrn SheriffJessica Childress, RN, MSN, [facility]

## 2014-10-09 NOTE — Progress Notes (Signed)
UR completed 

## 2014-10-09 NOTE — Progress Notes (Signed)
Mupirocin applied to each nostril x1.

## 2014-10-09 NOTE — Anesthesia Procedure Notes (Signed)
Procedure Name: Intubation Date/Time: 10/09/2014 1:46 PM Performed by: Pernell DupreADAMS, AMY A Pre-anesthesia Checklist: Patient identified, Patient being monitored, Timeout performed, Emergency Drugs available and Suction available Patient Re-evaluated:Patient Re-evaluated prior to inductionOxygen Delivery Method: Circle System Utilized Preoxygenation: Pre-oxygenation with 100% oxygen Intubation Type: IV induction, Rapid sequence and Cricoid Pressure applied Laryngoscope Size: 3 and Miller Grade View: Grade I Tube type: Oral Tube size: 7.0 mm Number of attempts: 1 Airway Equipment and Method: stylet Placement Confirmation: ETT inserted through vocal cords under direct vision,  positive ETCO2 and breath sounds checked- equal and bilateral Secured at: 21 cm Tube secured with: Tape Dental Injury: Teeth and Oropharynx as per pre-operative assessment

## 2014-10-10 ENCOUNTER — Encounter (HOSPITAL_COMMUNITY): Payer: Self-pay | Admitting: General Surgery

## 2014-10-10 NOTE — Anesthesia Postprocedure Evaluation (Signed)
  Anesthesia Post-op Note  Patient: Joel MaidensMichael H Konecny  Procedure(s) Performed: Procedure(s): VENTRAL HERNIORRHAPHY WITH MESH (N/A) INSERTION OF MESH (N/A)  Patient Location: 3A  Anesthesia Type:General  Level of Consciousness: awake, alert , oriented and patient cooperative  Airway and Oxygen Therapy: Patient Spontanous Breathing  Post-op Pain: none  Post-op Assessment: Post-op Vital signs reviewed, Patient's Cardiovascular Status Stable, Respiratory Function Stable, Patent Airway, No signs of Nausea or vomiting and Pain level controlled  Post-op Vital Signs: Reviewed and stable  Last Vitals:  Filed Vitals:   10/09/14 1551  BP: 119/67  Pulse: 52  Temp:   Resp: 16    Complications: No apparent anesthesia complications

## 2014-10-10 NOTE — Addendum Note (Signed)
Addendum  created 10/10/14 [id number] by Despina Hiddenobert J Tauriel Scronce, CRNA   Modules edited: Notes Section   Notes Section:  File: [id number]

## 2014-10-11 ENCOUNTER — Encounter (HOSPITAL_COMMUNITY): Payer: Self-pay | Admitting: General Surgery

## 2014-10-21 NOTE — Discharge Summary (Signed)
Physician Discharge Summary  Patient ID: Joel [name] MRN: [id number] DOB/AGE: [birth year] 49 y.o.  Admit date: 10/08/2014 Discharge date: 10/09/2014 Admission Diagnoses: Incarcerated ventral hernia  Discharge Diagnoses: Same Active Problems:   Incarcerated hernia   Incarcerated ventral hernia   Discharged Condition: good  Hospital Course: Patient is [name] [age] year old white male status post gastric banding several years ago who presents with [name] 24-hour history of worsening abdominal swelling and pain around the umbilicus. He was noted to have [name] ventral hernia with small bowel contents present on CT scan of the abdomen. This was reduced in the emergency room. The patient then underwent [name] ventral herniorrhaphy with mesh on 10/09/2014. Tolerated procedure well. His postoperative course has been unremarkable. His diet was advanced at difficulty. He is being discharged home on 10/09/2014 good improving condition.  Treatments: surgery: Ventral herniorrhaphy with mesh on 10/09/2014  Discharge Exam: Blood pressure 119/67, pulse 52, temperature 97.6 F (36.4 C), temperature source Oral, resp. rate 16, height 5\' 11"  (1.803 m), weight 105.7 kg (233 lb 0.4 oz), SpO2 100 %. General appearance: alert, cooperative and no distress Resp: clear to auscultation bilaterally Cardio: regular rate and rhythm, S1, S2 normal, no murmur, click, rub or gallop GI: Soft, dressing dry and intact.  Disposition: 01-Home or Self Care     Medication List    STOP taking these medications        oxyCODONE 5 MG immediate release tablet  Commonly known as:  Oxy IR/ROXICODONE      TAKE these medications        aspirin 325 MG tablet  Take 325 mg by mouth at bedtime.     losartan 50 MG tablet  Commonly known as:  COZAAR  Take 50 mg by mouth every morning.     methocarbamol 750 MG tablet  Commonly known as:  ROBAXIN  Take 750 mg by mouth 3 (three) times daily as needed. Pain     multivitamin with minerals  Tabs tablet  Take 1 tablet by mouth daily.     oxyCODONE-acetaminophen 7.5-325 MG per tablet  Commonly known as:  PERCOCET  Take 1-2 tablets by mouth every 4 (four) hours as needed.     ranitidine 75 MG tablet  Commonly known as:  ZANTAC  Take 75 mg by mouth every morning.     zolpidem 10 MG tablet  Commonly known as:  AMBIEN  Take 10 mg by mouth at bedtime as needed for sleep.           Follow-up Information    Follow up with Dalia HeadingJENKINS,Joel Leduc A, MD. Schedule an appointment as soon as possible for [name] visit on 10/22/2014.   Specialty:  General Surgery   Contact information:   [address] [place] [id number] [phone]       Signed: Franky MachoJENKINS,Joel [name] 10/21/2014, 11:01 AM

## 2014-11-21 DIAGNOSIS — Z9884 Bariatric surgery status: Secondary | ICD-10-CM | POA: Diagnosis not present

## 2014-11-21 DIAGNOSIS — Z6841 Body Mass Index (BMI) 40.0 and over, adult: Secondary | ICD-10-CM | POA: Diagnosis not present

## 2015-05-09 IMAGING — CT CT ABD-PELV W/ CM
2 of 5 series · 17 of 46 positions shown, 19 images · IV contrast (Omnipaque 300)
Comparison: None.

CLINICAL DATA: Acute periumbilical pain with focal swelling

EXAM:
CT ABDOMEN AND PELVIS WITH CONTRAST
TECHNIQUE: Multidetector CT imaging of the abdomen and pelvis was performed
using the standard protocol following bolus administration of
intravenous contrast.
CONTRAST:  50mL OMNIPAQUE IOHEXOL 300 MG/ML SOLN, 100mL OMNIPAQUE
IOHEXOL 300 MG/ML SOLN

[Series 2: abd_pel_with 5.0 b40f · axial · 0.86mm/px · z∈[-518,-78]mm · 14 of 100 slices shown, 16 images]
[im 6/100  soft-tissue]
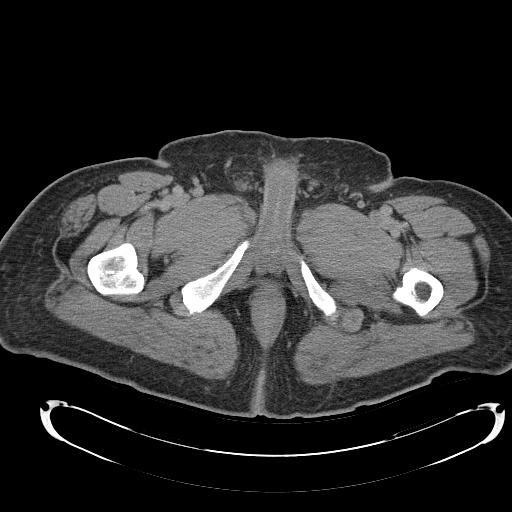
[im 6/100  bone]
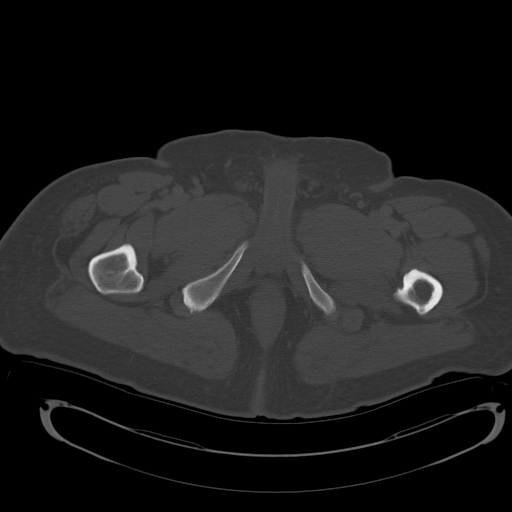
[im 11/100  soft-tissue]
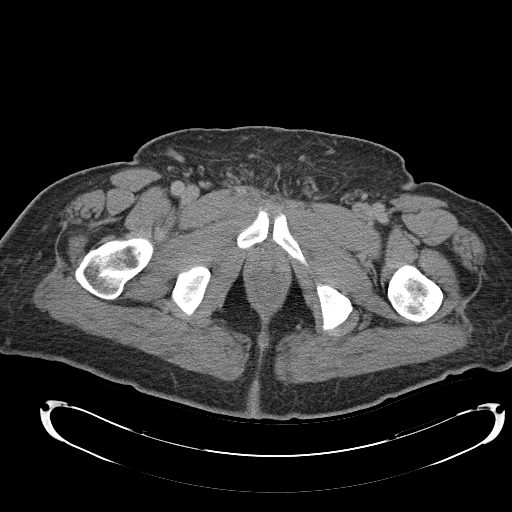
[im 21/100  soft-tissue]
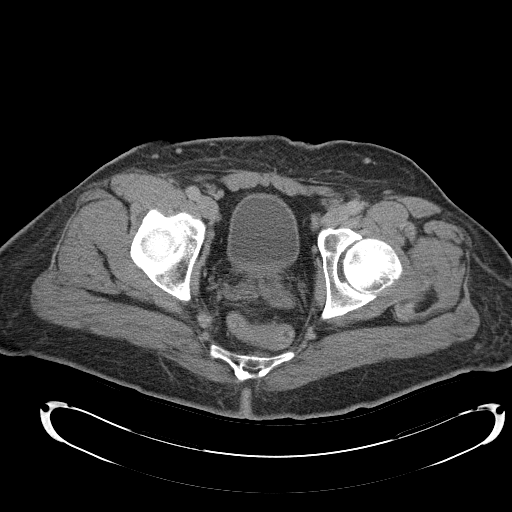
[im 27/100  soft-tissue]
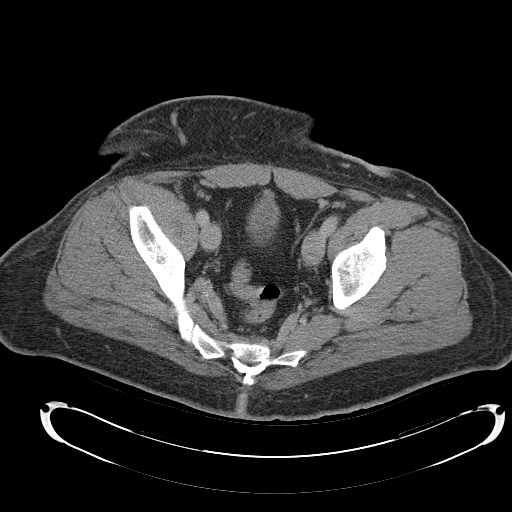
[im 32/100  soft-tissue]
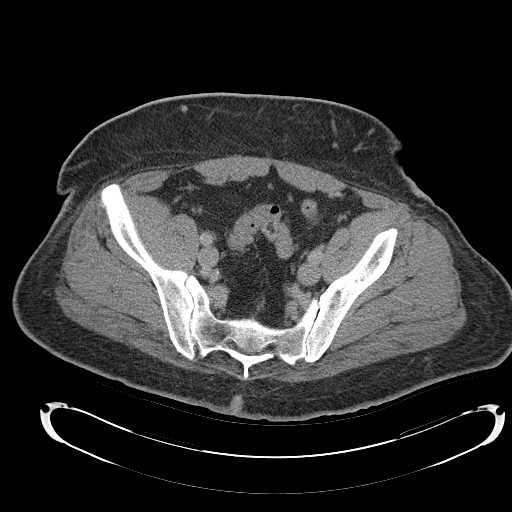
[im 42/100  soft-tissue]
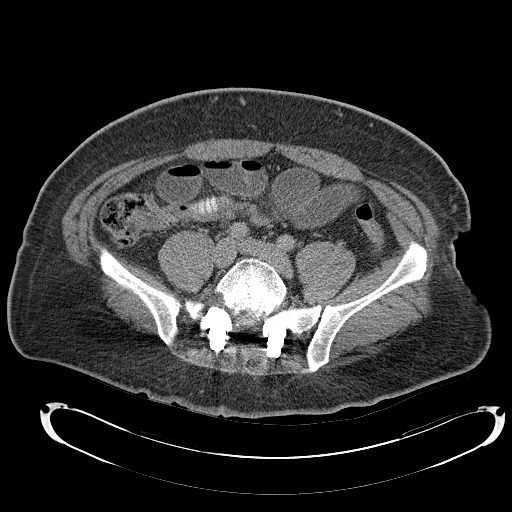
[im 47/100  soft-tissue]
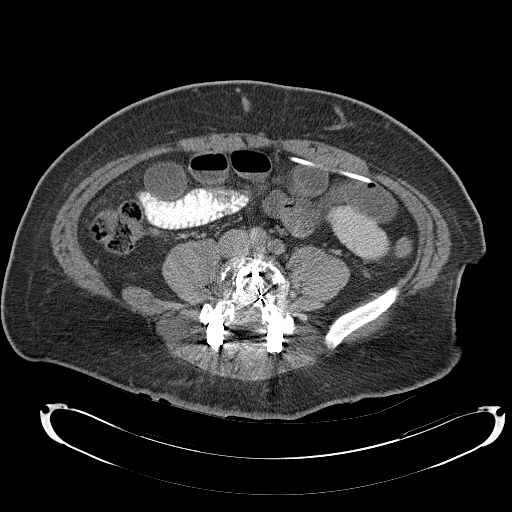
[im 53/100  soft-tissue]
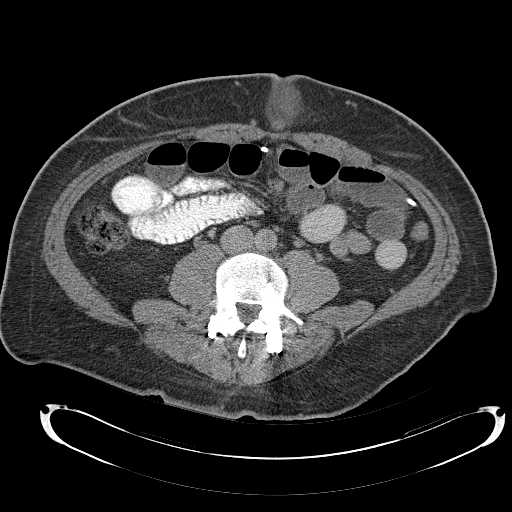
[im 58/100  soft-tissue]
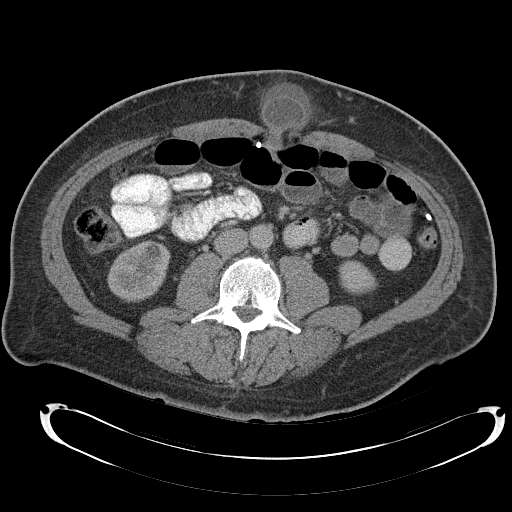
[im 58/100  bone]
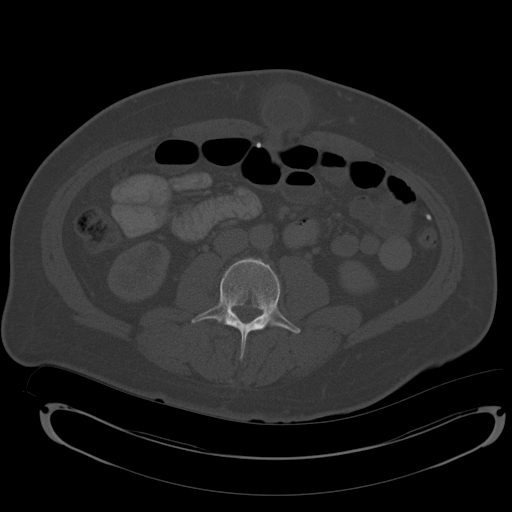
[im 68/100  soft-tissue]
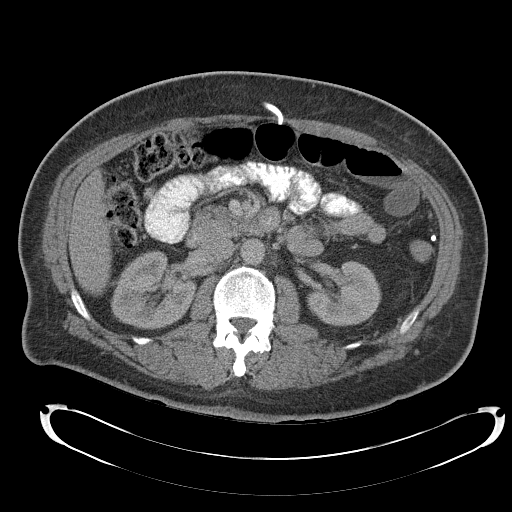
[im 73/100  soft-tissue]
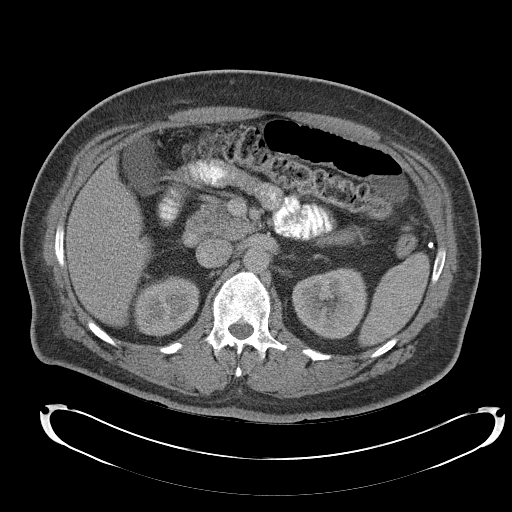
[im 79/100  soft-tissue]
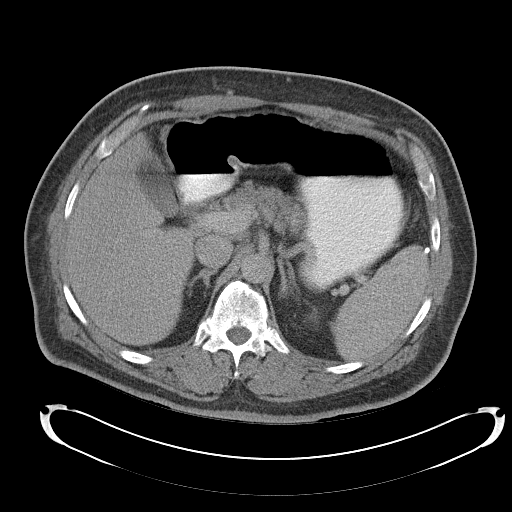
[im 89/100  soft-tissue]
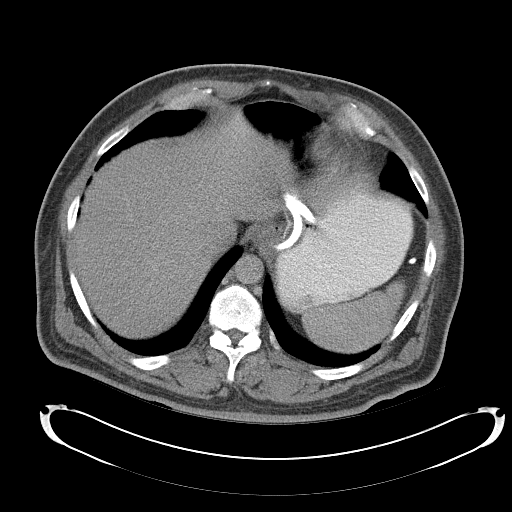
[im 94/100  soft-tissue]
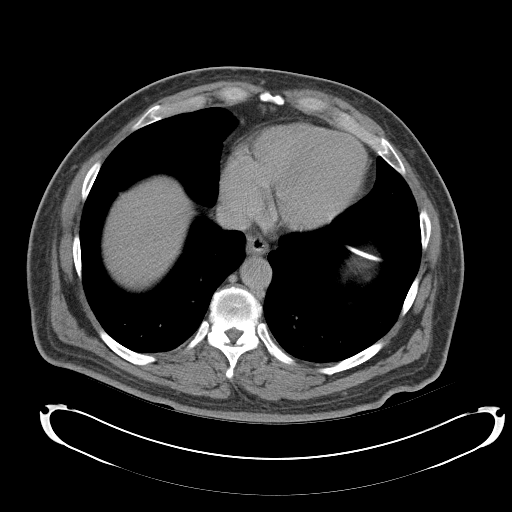

[Series 4: abd_pel_with 3.0 spo · coronal · 0.83mm/px · 3 of 100 slices shown]
[im 34/100  soft-tissue]
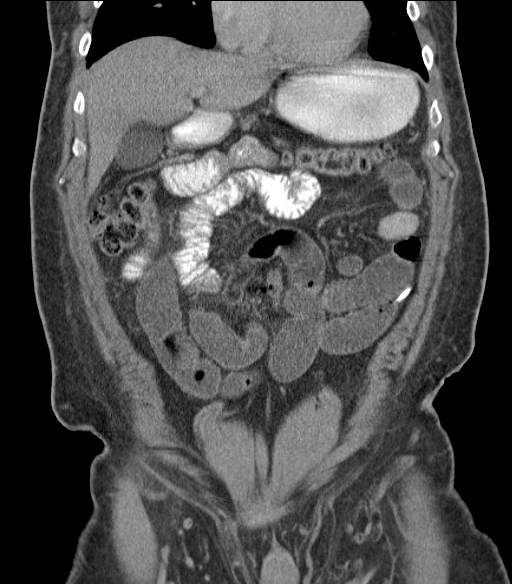
[im 45/100  soft-tissue]
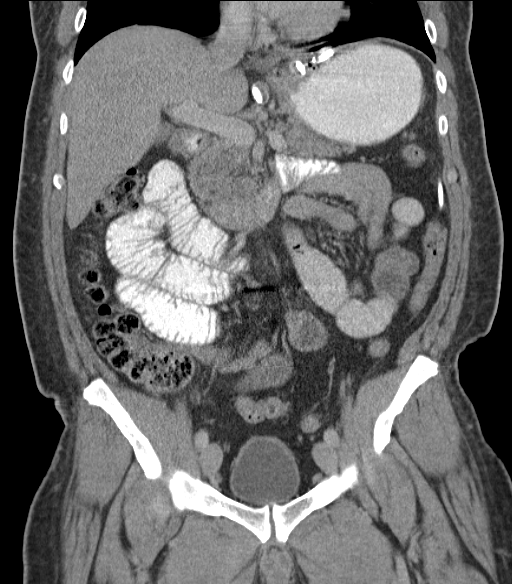
[im 56/100  soft-tissue]
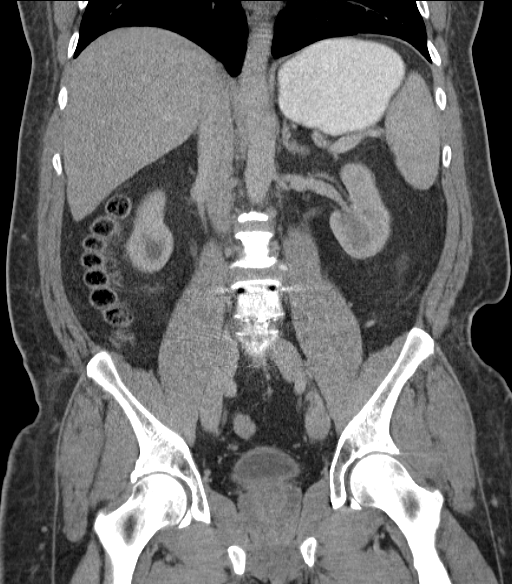

[17 of 46 positions shown; findings below may reference images not displayed]

FINDINGS: Lung bases are free of acute infiltrate or sizable effusion.

The liver, gallbladder, spleen, adrenal glands and pancreas are all
normal in their CT appearance. The kidneys are well visualized
bilaterally and demonstrate cystic change in the lower pole of the
right kidney. Normal excretion of contrast is noted bilaterally.

The appendix is not visualized although no inflammatory changes are
seen. The bladder is well distended. No pelvic mass lesion or
sidewall abnormality is noted.

Postsurgical changes are noted in the lumbar spine as well as a
gastric lap band along the proximal stomach.

There is a small umbilical hernia identified which measures
approximately 4.6 cm. A contains both an incarcerated loop of small
bowel as well as a small amount of fluid. The neck of the hernia is
1.8 cm. Mild small bowel dilatation is noted proximal to this
hernia.
IMPRESSION: Umbilical hernia containing an incarcerated loop of small bowel.
Small bowel dilatation is noted proximal to this hernia.

Renal cystic change.

Postsurgical changes.

## 2015-05-22 DIAGNOSIS — Z9884 Bariatric surgery status: Secondary | ICD-10-CM | POA: Diagnosis not present

## 2015-05-22 DIAGNOSIS — Z6841 Body Mass Index (BMI) 40.0 and over, adult: Secondary | ICD-10-CM | POA: Diagnosis not present

## 2015-09-30 DIAGNOSIS — M25872 Other specified joint disorders, left ankle and foot: Secondary | ICD-10-CM | POA: Diagnosis not present

## 2015-09-30 DIAGNOSIS — M13872 Other specified arthritis, left ankle and foot: Secondary | ICD-10-CM | POA: Diagnosis not present

## 2015-09-30 DIAGNOSIS — M25572 Pain in left ankle and joints of left foot: Secondary | ICD-10-CM | POA: Diagnosis not present

## 2015-09-30 DIAGNOSIS — Z7982 Long term (current) use of aspirin: Secondary | ICD-10-CM | POA: Diagnosis not present

## 2015-09-30 DIAGNOSIS — G8929 Other chronic pain: Secondary | ICD-10-CM | POA: Diagnosis not present

## 2015-09-30 DIAGNOSIS — I10 Essential (primary) hypertension: Secondary | ICD-10-CM | POA: Diagnosis not present

## 2015-09-30 DIAGNOSIS — M1990 Unspecified osteoarthritis, unspecified site: Secondary | ICD-10-CM | POA: Diagnosis not present

## 2015-09-30 DIAGNOSIS — Z79899 Other long term (current) drug therapy: Secondary | ICD-10-CM | POA: Diagnosis not present

## 2015-10-07 DIAGNOSIS — E139 Other specified diabetes mellitus without complications: Secondary | ICD-10-CM | POA: Diagnosis not present

## 2015-10-07 DIAGNOSIS — G8929 Other chronic pain: Secondary | ICD-10-CM | POA: Diagnosis not present

## 2015-10-07 DIAGNOSIS — I10 Essential (primary) hypertension: Secondary | ICD-10-CM | POA: Diagnosis not present

## 2015-10-07 DIAGNOSIS — F5101 Primary insomnia: Secondary | ICD-10-CM | POA: Diagnosis not present

## 2015-10-07 DIAGNOSIS — M545 Low back pain: Secondary | ICD-10-CM | POA: Diagnosis not present

## 2015-10-07 DIAGNOSIS — G4733 Obstructive sleep apnea (adult) (pediatric): Secondary | ICD-10-CM | POA: Diagnosis not present

## 2015-11-06 DIAGNOSIS — Z4651 Encounter for fitting and adjustment of gastric lap band: Secondary | ICD-10-CM | POA: Diagnosis not present

## 2016-01-30 DIAGNOSIS — M67912 Unspecified disorder of synovium and tendon, left shoulder: Secondary | ICD-10-CM | POA: Diagnosis not present

## 2016-01-30 DIAGNOSIS — M67911 Unspecified disorder of synovium and tendon, right shoulder: Secondary | ICD-10-CM | POA: Diagnosis not present

## 2016-03-03 DIAGNOSIS — M7542 Impingement syndrome of left shoulder: Secondary | ICD-10-CM | POA: Diagnosis not present

## 2016-08-03 DIAGNOSIS — F5101 Primary insomnia: Secondary | ICD-10-CM | POA: Diagnosis not present

## 2016-08-03 DIAGNOSIS — M545 Low back pain: Secondary | ICD-10-CM | POA: Diagnosis not present

## 2016-08-03 DIAGNOSIS — E119 Type 2 diabetes mellitus without complications: Secondary | ICD-10-CM | POA: Diagnosis not present

## 2016-08-03 DIAGNOSIS — N3281 Overactive bladder: Secondary | ICD-10-CM | POA: Diagnosis not present

## 2016-08-03 DIAGNOSIS — G4733 Obstructive sleep apnea (adult) (pediatric): Secondary | ICD-10-CM | POA: Diagnosis not present

## 2016-08-03 DIAGNOSIS — Z125 Encounter for screening for malignant neoplasm of prostate: Secondary | ICD-10-CM | POA: Diagnosis not present

## 2016-08-03 DIAGNOSIS — G8929 Other chronic pain: Secondary | ICD-10-CM | POA: Diagnosis not present

## 2016-08-03 DIAGNOSIS — I10 Essential (primary) hypertension: Secondary | ICD-10-CM | POA: Diagnosis not present

## 2016-08-03 DIAGNOSIS — Z1211 Encounter for screening for malignant neoplasm of colon: Secondary | ICD-10-CM | POA: Diagnosis not present

## 2016-09-16 DIAGNOSIS — M85872 Other specified disorders of bone density and structure, left ankle and foot: Secondary | ICD-10-CM | POA: Diagnosis not present

## 2016-09-16 DIAGNOSIS — M25572 Pain in left ankle and joints of left foot: Secondary | ICD-10-CM | POA: Diagnosis not present

## 2016-09-16 DIAGNOSIS — M13872 Other specified arthritis, left ankle and foot: Secondary | ICD-10-CM | POA: Diagnosis not present

## 2016-09-16 DIAGNOSIS — G8929 Other chronic pain: Secondary | ICD-10-CM | POA: Diagnosis not present

## 2016-10-29 DIAGNOSIS — I10 Essential (primary) hypertension: Secondary | ICD-10-CM | POA: Diagnosis not present

## 2016-10-29 DIAGNOSIS — Z79899 Other long term (current) drug therapy: Secondary | ICD-10-CM | POA: Diagnosis not present

## 2016-10-29 DIAGNOSIS — Z7982 Long term (current) use of aspirin: Secondary | ICD-10-CM | POA: Diagnosis not present

## 2016-10-29 DIAGNOSIS — M19072 Primary osteoarthritis, left ankle and foot: Secondary | ICD-10-CM | POA: Diagnosis not present

## 2017-01-14 DIAGNOSIS — R3121 Asymptomatic microscopic hematuria: Secondary | ICD-10-CM | POA: Diagnosis not present

## 2017-01-14 DIAGNOSIS — R3129 Other microscopic hematuria: Secondary | ICD-10-CM | POA: Diagnosis not present

## 2017-01-18 DIAGNOSIS — R3121 Asymptomatic microscopic hematuria: Secondary | ICD-10-CM | POA: Diagnosis not present

## 2017-01-18 DIAGNOSIS — N3281 Overactive bladder: Secondary | ICD-10-CM | POA: Diagnosis not present

## 2017-01-28 DIAGNOSIS — M25572 Pain in left ankle and joints of left foot: Secondary | ICD-10-CM | POA: Diagnosis not present

## 2017-01-28 DIAGNOSIS — M19072 Primary osteoarthritis, left ankle and foot: Secondary | ICD-10-CM | POA: Diagnosis not present

## 2017-01-28 DIAGNOSIS — Z79899 Other long term (current) drug therapy: Secondary | ICD-10-CM | POA: Diagnosis not present

## 2017-01-28 DIAGNOSIS — S88112D Complete traumatic amputation at level between knee and ankle, left lower leg, subsequent encounter: Secondary | ICD-10-CM | POA: Diagnosis not present

## 2017-01-28 DIAGNOSIS — S88111D Complete traumatic amputation at level between knee and ankle, right lower leg, subsequent encounter: Secondary | ICD-10-CM | POA: Diagnosis not present

## 2017-01-28 DIAGNOSIS — Z7982 Long term (current) use of aspirin: Secondary | ICD-10-CM | POA: Diagnosis not present

## 2017-01-28 DIAGNOSIS — Z89511 Acquired absence of right leg below knee: Secondary | ICD-10-CM | POA: Diagnosis not present

## 2017-01-28 DIAGNOSIS — I10 Essential (primary) hypertension: Secondary | ICD-10-CM | POA: Diagnosis not present

## 2017-02-21 DIAGNOSIS — S46811A Strain of other muscles, fascia and tendons at shoulder and upper arm level, right arm, initial encounter: Secondary | ICD-10-CM | POA: Diagnosis not present

## 2017-02-22 DIAGNOSIS — M67912 Unspecified disorder of synovium and tendon, left shoulder: Secondary | ICD-10-CM | POA: Diagnosis not present

## 2017-02-22 DIAGNOSIS — M25522 Pain in left elbow: Secondary | ICD-10-CM | POA: Diagnosis not present

## 2017-03-21 DIAGNOSIS — G894 Chronic pain syndrome: Secondary | ICD-10-CM | POA: Diagnosis not present

## 2017-03-21 DIAGNOSIS — I10 Essential (primary) hypertension: Secondary | ICD-10-CM | POA: Diagnosis not present

## 2017-03-21 DIAGNOSIS — E119 Type 2 diabetes mellitus without complications: Secondary | ICD-10-CM | POA: Diagnosis not present

## 2017-03-21 DIAGNOSIS — F5101 Primary insomnia: Secondary | ICD-10-CM | POA: Diagnosis not present

## 2017-03-21 DIAGNOSIS — M67912 Unspecified disorder of synovium and tendon, left shoulder: Secondary | ICD-10-CM | POA: Diagnosis not present

## 2017-03-25 DIAGNOSIS — M25572 Pain in left ankle and joints of left foot: Secondary | ICD-10-CM | POA: Diagnosis not present

## 2017-08-05 DIAGNOSIS — E119 Type 2 diabetes mellitus without complications: Secondary | ICD-10-CM | POA: Diagnosis not present

## 2017-08-05 DIAGNOSIS — G8929 Other chronic pain: Secondary | ICD-10-CM | POA: Diagnosis not present

## 2017-08-05 DIAGNOSIS — Z125 Encounter for screening for malignant neoplasm of prostate: Secondary | ICD-10-CM | POA: Diagnosis not present

## 2017-08-05 DIAGNOSIS — I10 Essential (primary) hypertension: Secondary | ICD-10-CM | POA: Diagnosis not present

## 2017-08-05 DIAGNOSIS — F5101 Primary insomnia: Secondary | ICD-10-CM | POA: Diagnosis not present

## 2017-08-05 DIAGNOSIS — Z23 Encounter for immunization: Secondary | ICD-10-CM | POA: Diagnosis not present

## 2017-10-03 DIAGNOSIS — M19172 Post-traumatic osteoarthritis, left ankle and foot: Secondary | ICD-10-CM | POA: Diagnosis not present

## 2017-10-03 DIAGNOSIS — G8918 Other acute postprocedural pain: Secondary | ICD-10-CM | POA: Diagnosis not present

## 2017-10-03 DIAGNOSIS — M12572 Traumatic arthropathy, left ankle and foot: Secondary | ICD-10-CM | POA: Diagnosis not present

## 2017-10-03 DIAGNOSIS — M25572 Pain in left ankle and joints of left foot: Secondary | ICD-10-CM | POA: Diagnosis not present

## 2017-10-04 DIAGNOSIS — M12572 Traumatic arthropathy, left ankle and foot: Secondary | ICD-10-CM | POA: Diagnosis not present

## 2017-10-04 DIAGNOSIS — M25572 Pain in left ankle and joints of left foot: Secondary | ICD-10-CM | POA: Diagnosis not present

## 2018-11-09 DIAGNOSIS — F5101 Primary insomnia: Secondary | ICD-10-CM | POA: Diagnosis not present

## 2018-11-09 DIAGNOSIS — Z125 Encounter for screening for malignant neoplasm of prostate: Secondary | ICD-10-CM | POA: Diagnosis not present

## 2018-11-09 DIAGNOSIS — G894 Chronic pain syndrome: Secondary | ICD-10-CM | POA: Diagnosis not present

## 2018-11-09 DIAGNOSIS — I10 Essential (primary) hypertension: Secondary | ICD-10-CM | POA: Diagnosis not present

## 2018-11-09 DIAGNOSIS — E119 Type 2 diabetes mellitus without complications: Secondary | ICD-10-CM | POA: Diagnosis not present

## 2019-02-04 DIAGNOSIS — L259 Unspecified contact dermatitis, unspecified cause: Secondary | ICD-10-CM | POA: Diagnosis not present

## 2019-04-03 DIAGNOSIS — M19072 Primary osteoarthritis, left ankle and foot: Secondary | ICD-10-CM | POA: Diagnosis not present

## 2019-06-19 DIAGNOSIS — I10 Essential (primary) hypertension: Secondary | ICD-10-CM | POA: Diagnosis not present

## 2019-06-19 DIAGNOSIS — F5101 Primary insomnia: Secondary | ICD-10-CM | POA: Diagnosis not present

## 2019-06-19 DIAGNOSIS — E119 Type 2 diabetes mellitus without complications: Secondary | ICD-10-CM | POA: Diagnosis not present

## 2019-06-19 DIAGNOSIS — K219 Gastro-esophageal reflux disease without esophagitis: Secondary | ICD-10-CM | POA: Diagnosis not present

## 2019-06-19 DIAGNOSIS — G4733 Obstructive sleep apnea (adult) (pediatric): Secondary | ICD-10-CM | POA: Diagnosis not present

## 2019-06-19 DIAGNOSIS — G894 Chronic pain syndrome: Secondary | ICD-10-CM | POA: Diagnosis not present

## 2019-12-10 DIAGNOSIS — R0789 Other chest pain: Secondary | ICD-10-CM | POA: Diagnosis not present

## 2019-12-10 DIAGNOSIS — L42 Pityriasis rosea: Secondary | ICD-10-CM | POA: Diagnosis not present

## 2020-11-14 DIAGNOSIS — E118 Type 2 diabetes mellitus with unspecified complications: Secondary | ICD-10-CM | POA: Diagnosis not present

## 2020-11-14 DIAGNOSIS — H5789 Other specified disorders of eye and adnexa: Secondary | ICD-10-CM | POA: Diagnosis not present

## 2020-11-14 DIAGNOSIS — H5712 Ocular pain, left eye: Secondary | ICD-10-CM | POA: Diagnosis not present

## 2020-11-14 DIAGNOSIS — E6609 Other obesity due to excess calories: Secondary | ICD-10-CM | POA: Diagnosis not present

## 2020-11-14 DIAGNOSIS — E119 Type 2 diabetes mellitus without complications: Secondary | ICD-10-CM | POA: Diagnosis not present

## 2020-11-14 DIAGNOSIS — L298 Other pruritus: Secondary | ICD-10-CM | POA: Diagnosis not present

## 2020-11-14 DIAGNOSIS — Z7689 Persons encountering health services in other specified circumstances: Secondary | ICD-10-CM | POA: Diagnosis not present

## 2020-11-14 DIAGNOSIS — M545 Low back pain, unspecified: Secondary | ICD-10-CM | POA: Diagnosis not present

## 2020-11-14 DIAGNOSIS — H1032 Unspecified acute conjunctivitis, left eye: Secondary | ICD-10-CM | POA: Diagnosis not present

## 2021-01-14 DIAGNOSIS — E139 Other specified diabetes mellitus without complications: Secondary | ICD-10-CM | POA: Diagnosis not present

## 2021-01-14 DIAGNOSIS — F5101 Primary insomnia: Secondary | ICD-10-CM | POA: Diagnosis not present

## 2021-01-14 DIAGNOSIS — I10 Essential (primary) hypertension: Secondary | ICD-10-CM | POA: Diagnosis not present

## 2021-01-14 DIAGNOSIS — Z125 Encounter for screening for malignant neoplasm of prostate: Secondary | ICD-10-CM | POA: Diagnosis not present

## 2021-01-14 DIAGNOSIS — G8929 Other chronic pain: Secondary | ICD-10-CM | POA: Diagnosis not present

## 2021-04-20 DIAGNOSIS — G8929 Other chronic pain: Secondary | ICD-10-CM | POA: Diagnosis not present

## 2021-04-20 DIAGNOSIS — F5101 Primary insomnia: Secondary | ICD-10-CM | POA: Diagnosis not present

## 2021-04-20 DIAGNOSIS — I10 Essential (primary) hypertension: Secondary | ICD-10-CM | POA: Diagnosis not present

## 2021-04-20 DIAGNOSIS — K219 Gastro-esophageal reflux disease without esophagitis: Secondary | ICD-10-CM | POA: Diagnosis not present

## 2021-04-20 DIAGNOSIS — E119 Type 2 diabetes mellitus without complications: Secondary | ICD-10-CM | POA: Diagnosis not present

## 2021-07-09 DIAGNOSIS — H5789 Other specified disorders of eye and adnexa: Secondary | ICD-10-CM | POA: Diagnosis not present

## 2021-07-09 DIAGNOSIS — E119 Type 2 diabetes mellitus without complications: Secondary | ICD-10-CM | POA: Diagnosis not present

## 2021-08-03 DIAGNOSIS — F5101 Primary insomnia: Secondary | ICD-10-CM | POA: Diagnosis not present

## 2021-08-03 DIAGNOSIS — G8929 Other chronic pain: Secondary | ICD-10-CM | POA: Diagnosis not present

## 2021-08-03 DIAGNOSIS — I10 Essential (primary) hypertension: Secondary | ICD-10-CM | POA: Diagnosis not present

## 2021-08-03 DIAGNOSIS — K219 Gastro-esophageal reflux disease without esophagitis: Secondary | ICD-10-CM | POA: Diagnosis not present

## 2021-08-03 DIAGNOSIS — E119 Type 2 diabetes mellitus without complications: Secondary | ICD-10-CM | POA: Diagnosis not present

## 2021-08-12 DIAGNOSIS — H5789 Other specified disorders of eye and adnexa: Secondary | ICD-10-CM | POA: Diagnosis not present

## 2021-08-12 DIAGNOSIS — E119 Type 2 diabetes mellitus without complications: Secondary | ICD-10-CM | POA: Diagnosis not present

## 2021-09-11 DIAGNOSIS — H5789 Other specified disorders of eye and adnexa: Secondary | ICD-10-CM | POA: Diagnosis not present

## 2021-09-11 DIAGNOSIS — E119 Type 2 diabetes mellitus without complications: Secondary | ICD-10-CM | POA: Diagnosis not present

## 2021-11-11 DIAGNOSIS — G8929 Other chronic pain: Secondary | ICD-10-CM | POA: Diagnosis not present

## 2021-11-11 DIAGNOSIS — I10 Essential (primary) hypertension: Secondary | ICD-10-CM | POA: Diagnosis not present

## 2021-11-11 DIAGNOSIS — F5101 Primary insomnia: Secondary | ICD-10-CM | POA: Diagnosis not present

## 2021-11-11 DIAGNOSIS — E119 Type 2 diabetes mellitus without complications: Secondary | ICD-10-CM | POA: Diagnosis not present

## 2021-11-11 DIAGNOSIS — K219 Gastro-esophageal reflux disease without esophagitis: Secondary | ICD-10-CM | POA: Diagnosis not present

## 2022-02-16 DIAGNOSIS — I10 Essential (primary) hypertension: Secondary | ICD-10-CM | POA: Diagnosis not present

## 2022-02-16 DIAGNOSIS — K219 Gastro-esophageal reflux disease without esophagitis: Secondary | ICD-10-CM | POA: Diagnosis not present

## 2022-02-16 DIAGNOSIS — F5101 Primary insomnia: Secondary | ICD-10-CM | POA: Diagnosis not present

## 2022-02-16 DIAGNOSIS — E119 Type 2 diabetes mellitus without complications: Secondary | ICD-10-CM | POA: Diagnosis not present

## 2022-02-16 DIAGNOSIS — G8929 Other chronic pain: Secondary | ICD-10-CM | POA: Diagnosis not present

## 2022-02-16 DIAGNOSIS — Z125 Encounter for screening for malignant neoplasm of prostate: Secondary | ICD-10-CM | POA: Diagnosis not present

## 2022-07-16 ENCOUNTER — Other Ambulatory Visit (HOSPITAL_COMMUNITY): Payer: Self-pay

## 2022-07-16 DIAGNOSIS — E119 Type 2 diabetes mellitus without complications: Secondary | ICD-10-CM | POA: Diagnosis not present

## 2022-07-16 DIAGNOSIS — G8929 Other chronic pain: Secondary | ICD-10-CM | POA: Diagnosis not present

## 2022-07-16 DIAGNOSIS — Z0000 Encounter for general adult medical examination without abnormal findings: Secondary | ICD-10-CM | POA: Diagnosis not present

## 2022-07-16 DIAGNOSIS — K219 Gastro-esophageal reflux disease without esophagitis: Secondary | ICD-10-CM | POA: Diagnosis not present

## 2022-07-16 DIAGNOSIS — I10 Essential (primary) hypertension: Secondary | ICD-10-CM | POA: Diagnosis not present

## 2022-07-16 DIAGNOSIS — F5101 Primary insomnia: Secondary | ICD-10-CM | POA: Diagnosis not present

## 2022-07-16 MED ORDER — OXYCODONE HCL 5 MG PO TABS
10.0000 mg | ORAL_TABLET | ORAL | 0 refills | Status: DC | PRN
  Filled 2022-07-16: qty 300, 30d supply, fill #0

## 2022-08-17 ENCOUNTER — Other Ambulatory Visit (HOSPITAL_COMMUNITY): Payer: Self-pay

## 2022-08-17 MED ORDER — OXYCODONE HCL 5 MG PO TABS
10.0000 mg | ORAL_TABLET | ORAL | 0 refills | Status: AC | PRN
  Filled 2022-08-17: qty 300, 17d supply, fill #0

## 2022-08-18 ENCOUNTER — Other Ambulatory Visit (HOSPITAL_COMMUNITY): Payer: Self-pay

## 2022-09-14 ENCOUNTER — Other Ambulatory Visit (HOSPITAL_COMMUNITY): Payer: Self-pay

## 2022-09-14 MED ORDER — OXYCODONE HCL 5 MG PO TABS
ORAL_TABLET | ORAL | 0 refills | Status: AC
  Filled 2022-09-14: qty 300, 30d supply, fill #0

## 2022-09-15 ENCOUNTER — Other Ambulatory Visit (HOSPITAL_COMMUNITY): Payer: Self-pay

## 2022-10-13 ENCOUNTER — Other Ambulatory Visit (HOSPITAL_COMMUNITY): Payer: Self-pay

## 2022-10-13 MED ORDER — OXYCODONE HCL 5 MG PO TABS
10.0000 mg | ORAL_TABLET | ORAL | 0 refills | Status: DC | PRN
  Filled 2022-10-13: qty 300, 30d supply, fill #0

## 2022-10-15 ENCOUNTER — Other Ambulatory Visit (HOSPITAL_COMMUNITY): Payer: Self-pay

## 2022-10-25 DIAGNOSIS — G8929 Other chronic pain: Secondary | ICD-10-CM | POA: Diagnosis not present

## 2022-10-25 DIAGNOSIS — K219 Gastro-esophageal reflux disease without esophagitis: Secondary | ICD-10-CM | POA: Diagnosis not present

## 2022-10-25 DIAGNOSIS — F5101 Primary insomnia: Secondary | ICD-10-CM | POA: Diagnosis not present

## 2022-10-25 DIAGNOSIS — I10 Essential (primary) hypertension: Secondary | ICD-10-CM | POA: Diagnosis not present

## 2022-10-25 DIAGNOSIS — L989 Disorder of the skin and subcutaneous tissue, unspecified: Secondary | ICD-10-CM | POA: Diagnosis not present

## 2022-10-25 DIAGNOSIS — E119 Type 2 diabetes mellitus without complications: Secondary | ICD-10-CM | POA: Diagnosis not present

## 2022-11-12 ENCOUNTER — Other Ambulatory Visit (HOSPITAL_COMMUNITY): Payer: Self-pay

## 2022-11-12 MED ORDER — OXYCODONE HCL 5 MG PO TABS
10.0000 mg | ORAL_TABLET | ORAL | 0 refills | Status: AC | PRN
  Filled 2022-11-12: qty 300, 30d supply, fill #0

## 2022-11-18 ENCOUNTER — Other Ambulatory Visit (HOSPITAL_COMMUNITY): Payer: Self-pay

## 2022-11-23 DIAGNOSIS — C44319 Basal cell carcinoma of skin of other parts of face: Secondary | ICD-10-CM | POA: Diagnosis not present

## 2022-12-16 ENCOUNTER — Other Ambulatory Visit (HOSPITAL_COMMUNITY): Payer: Self-pay

## 2022-12-16 MED ORDER — OXYCODONE HCL 5 MG PO TABS
10.0000 mg | ORAL_TABLET | ORAL | 0 refills | Status: DC
  Filled 2022-12-16: qty 300, 30d supply, fill #0

## 2022-12-17 ENCOUNTER — Other Ambulatory Visit (HOSPITAL_COMMUNITY): Payer: Self-pay

## 2022-12-27 DIAGNOSIS — Z08 Encounter for follow-up examination after completed treatment for malignant neoplasm: Secondary | ICD-10-CM | POA: Diagnosis not present

## 2022-12-27 DIAGNOSIS — Z85828 Personal history of other malignant neoplasm of skin: Secondary | ICD-10-CM | POA: Diagnosis not present

## 2023-01-14 ENCOUNTER — Other Ambulatory Visit (HOSPITAL_COMMUNITY): Payer: Self-pay

## 2023-01-14 MED ORDER — OXYCODONE HCL 5 MG PO TABS
ORAL_TABLET | ORAL | 0 refills | Status: DC
  Filled 2023-01-14: qty 300, 30d supply, fill #0

## 2023-02-11 ENCOUNTER — Other Ambulatory Visit (HOSPITAL_COMMUNITY): Payer: Self-pay

## 2023-02-11 MED ORDER — OXYCODONE HCL 5 MG PO TABS
10.0000 mg | ORAL_TABLET | ORAL | 0 refills | Status: DC
  Filled 2023-02-11: qty 300, 30d supply, fill #0

## 2023-02-12 ENCOUNTER — Other Ambulatory Visit (HOSPITAL_COMMUNITY): Payer: Self-pay

## 2023-03-14 ENCOUNTER — Other Ambulatory Visit (HOSPITAL_COMMUNITY): Payer: Self-pay

## 2023-03-14 MED ORDER — OXYCODONE HCL 5 MG PO TABS
ORAL_TABLET | ORAL | 0 refills | Status: AC
  Filled 2023-03-14: qty 300, 30d supply, fill #0

## 2023-03-15 ENCOUNTER — Other Ambulatory Visit (HOSPITAL_COMMUNITY): Payer: Self-pay

## 2023-03-30 ENCOUNTER — Other Ambulatory Visit (HOSPITAL_COMMUNITY): Payer: Self-pay

## 2023-03-30 MED ORDER — LOSARTAN POTASSIUM 50 MG PO TABS
50.0000 mg | ORAL_TABLET | Freq: Every day | ORAL | 0 refills | Status: AC
  Filled 2023-03-30 (×2): qty 90, 90d supply, fill #0

## 2023-04-04 ENCOUNTER — Other Ambulatory Visit (HOSPITAL_COMMUNITY): Payer: Self-pay

## 2023-04-05 ENCOUNTER — Other Ambulatory Visit (HOSPITAL_COMMUNITY): Payer: Self-pay

## 2023-04-13 ENCOUNTER — Other Ambulatory Visit (HOSPITAL_COMMUNITY): Payer: Self-pay

## 2023-04-13 MED ORDER — OXYCODONE HCL 5 MG PO TABS
10.0000 mg | ORAL_TABLET | ORAL | 0 refills | Status: AC | PRN
  Filled 2023-04-14: qty 300, 30d supply, fill #0

## 2023-04-14 ENCOUNTER — Other Ambulatory Visit: Payer: Self-pay

## 2023-04-14 ENCOUNTER — Other Ambulatory Visit (HOSPITAL_COMMUNITY): Payer: Self-pay

## 2023-05-05 DIAGNOSIS — F5101 Primary insomnia: Secondary | ICD-10-CM | POA: Diagnosis not present

## 2023-05-05 DIAGNOSIS — I10 Essential (primary) hypertension: Secondary | ICD-10-CM | POA: Diagnosis not present

## 2023-05-05 DIAGNOSIS — G8929 Other chronic pain: Secondary | ICD-10-CM | POA: Diagnosis not present

## 2023-05-05 DIAGNOSIS — Z89511 Acquired absence of right leg below knee: Secondary | ICD-10-CM | POA: Diagnosis not present

## 2023-05-05 DIAGNOSIS — E119 Type 2 diabetes mellitus without complications: Secondary | ICD-10-CM | POA: Diagnosis not present

## 2023-05-05 DIAGNOSIS — Z125 Encounter for screening for malignant neoplasm of prostate: Secondary | ICD-10-CM | POA: Diagnosis not present

## 2023-05-12 ENCOUNTER — Other Ambulatory Visit (HOSPITAL_COMMUNITY): Payer: Self-pay

## 2023-05-12 MED ORDER — OXYCODONE HCL 5 MG PO TABS
10.0000 mg | ORAL_TABLET | ORAL | 0 refills | Status: AC
  Filled 2023-05-12 – 2023-05-13 (×2): qty 300, 30d supply, fill #0

## 2023-05-13 ENCOUNTER — Other Ambulatory Visit: Payer: Self-pay

## 2023-05-13 ENCOUNTER — Other Ambulatory Visit (HOSPITAL_COMMUNITY): Payer: Self-pay

## 2023-05-16 ENCOUNTER — Other Ambulatory Visit (HOSPITAL_COMMUNITY): Payer: Self-pay

## 2023-06-13 ENCOUNTER — Other Ambulatory Visit (HOSPITAL_COMMUNITY): Payer: Self-pay

## 2023-06-13 MED ORDER — OXYCODONE HCL 5 MG PO TABS
10.0000 mg | ORAL_TABLET | ORAL | 0 refills | Status: AC | PRN
  Filled 2023-06-13: qty 300, 30d supply, fill #0

## 2023-06-20 ENCOUNTER — Other Ambulatory Visit (HOSPITAL_COMMUNITY): Payer: Self-pay

## 2023-06-27 DIAGNOSIS — Z08 Encounter for follow-up examination after completed treatment for malignant neoplasm: Secondary | ICD-10-CM | POA: Diagnosis not present

## 2023-06-27 DIAGNOSIS — Z85828 Personal history of other malignant neoplasm of skin: Secondary | ICD-10-CM | POA: Diagnosis not present

## 2023-07-07 ENCOUNTER — Other Ambulatory Visit (HOSPITAL_COMMUNITY): Payer: Self-pay

## 2023-07-07 MED ORDER — ZOLPIDEM TARTRATE 10 MG PO TABS
10.0000 mg | ORAL_TABLET | Freq: Every evening | ORAL | 5 refills | Status: AC | PRN
  Filled 2023-07-07 – 2023-07-19 (×2): qty 30, 30d supply, fill #0

## 2023-07-15 ENCOUNTER — Other Ambulatory Visit (HOSPITAL_COMMUNITY): Payer: Self-pay

## 2023-07-15 MED ORDER — OXYCODONE HCL 5 MG PO TABS
ORAL_TABLET | ORAL | 0 refills | Status: AC
  Filled 2023-07-15: qty 300, 30d supply, fill #0

## 2023-07-16 ENCOUNTER — Other Ambulatory Visit (HOSPITAL_COMMUNITY): Payer: Self-pay

## 2023-07-18 ENCOUNTER — Other Ambulatory Visit (HOSPITAL_COMMUNITY): Payer: Self-pay

## 2023-07-19 ENCOUNTER — Other Ambulatory Visit (HOSPITAL_COMMUNITY): Payer: Self-pay

## 2023-08-03 DIAGNOSIS — S86912A Strain of unspecified muscle(s) and tendon(s) at lower leg level, left leg, initial encounter: Secondary | ICD-10-CM | POA: Diagnosis not present

## 2023-08-03 DIAGNOSIS — K219 Gastro-esophageal reflux disease without esophagitis: Secondary | ICD-10-CM | POA: Diagnosis not present

## 2023-08-03 DIAGNOSIS — I10 Essential (primary) hypertension: Secondary | ICD-10-CM | POA: Diagnosis not present

## 2023-08-03 DIAGNOSIS — Z0000 Encounter for general adult medical examination without abnormal findings: Secondary | ICD-10-CM | POA: Diagnosis not present

## 2023-08-03 DIAGNOSIS — E119 Type 2 diabetes mellitus without complications: Secondary | ICD-10-CM | POA: Diagnosis not present

## 2023-08-03 DIAGNOSIS — G8929 Other chronic pain: Secondary | ICD-10-CM | POA: Diagnosis not present

## 2023-08-03 DIAGNOSIS — F5101 Primary insomnia: Secondary | ICD-10-CM | POA: Diagnosis not present

## 2023-08-12 ENCOUNTER — Other Ambulatory Visit (HOSPITAL_COMMUNITY): Payer: Self-pay

## 2023-08-12 MED ORDER — OXYCODONE HCL 5 MG PO TABS
ORAL_TABLET | ORAL | 0 refills | Status: DC
  Filled 2023-08-12: qty 300, 30d supply, fill #0

## 2023-08-16 ENCOUNTER — Other Ambulatory Visit (HOSPITAL_COMMUNITY): Payer: Self-pay

## 2023-08-17 ENCOUNTER — Other Ambulatory Visit (HOSPITAL_COMMUNITY): Payer: Self-pay

## 2023-09-13 ENCOUNTER — Other Ambulatory Visit (HOSPITAL_COMMUNITY): Payer: Self-pay

## 2023-09-19 ENCOUNTER — Other Ambulatory Visit (HOSPITAL_COMMUNITY): Payer: Self-pay

## 2023-09-19 MED ORDER — OXYCODONE HCL 5 MG PO TABS
ORAL_TABLET | ORAL | 0 refills | Status: DC
  Filled 2023-09-19: qty 300, 30d supply, fill #0

## 2023-09-22 ENCOUNTER — Other Ambulatory Visit (HOSPITAL_COMMUNITY): Payer: Self-pay

## 2023-10-14 DIAGNOSIS — M1712 Unilateral primary osteoarthritis, left knee: Secondary | ICD-10-CM | POA: Diagnosis not present

## 2023-10-17 ENCOUNTER — Other Ambulatory Visit (HOSPITAL_COMMUNITY): Payer: Self-pay

## 2023-10-17 MED ORDER — OXYCODONE HCL 5 MG PO TABS
10.0000 mg | ORAL_TABLET | ORAL | 0 refills | Status: DC
  Filled 2023-10-17: qty 300, 17d supply, fill #0

## 2023-10-21 ENCOUNTER — Other Ambulatory Visit (HOSPITAL_COMMUNITY): Payer: Self-pay

## 2023-11-15 ENCOUNTER — Other Ambulatory Visit (HOSPITAL_COMMUNITY): Payer: Self-pay

## 2023-11-15 MED ORDER — OXYCODONE HCL 5 MG PO TABS
ORAL_TABLET | ORAL | 0 refills | Status: AC
  Filled 2023-11-15: qty 300, 30d supply, fill #0

## 2023-11-16 ENCOUNTER — Other Ambulatory Visit (HOSPITAL_COMMUNITY): Payer: Self-pay

## 2023-11-21 ENCOUNTER — Other Ambulatory Visit (HOSPITAL_COMMUNITY): Payer: Self-pay

## 2023-11-21 DIAGNOSIS — L259 Unspecified contact dermatitis, unspecified cause: Secondary | ICD-10-CM | POA: Diagnosis not present

## 2023-11-21 DIAGNOSIS — G8929 Other chronic pain: Secondary | ICD-10-CM | POA: Diagnosis not present

## 2023-11-21 DIAGNOSIS — I10 Essential (primary) hypertension: Secondary | ICD-10-CM | POA: Diagnosis not present

## 2023-11-21 DIAGNOSIS — E119 Type 2 diabetes mellitus without complications: Secondary | ICD-10-CM | POA: Diagnosis not present

## 2023-11-21 DIAGNOSIS — F5101 Primary insomnia: Secondary | ICD-10-CM | POA: Diagnosis not present

## 2023-11-21 MED ORDER — TRIAMCINOLONE ACETONIDE 0.1 % EX CREA
1.0000 | TOPICAL_CREAM | Freq: Two times a day (BID) | CUTANEOUS | 0 refills | Status: DC
Start: 2023-11-21 — End: 2023-11-21
  Filled 2023-11-21: qty 454, 30d supply, fill #0

## 2023-11-21 MED ORDER — PREDNISONE 10 MG PO TABS
ORAL_TABLET | ORAL | 0 refills | Status: AC
  Filled 2023-11-21 (×2): qty 20, 8d supply, fill #0

## 2023-12-11 DIAGNOSIS — M25552 Pain in left hip: Secondary | ICD-10-CM | POA: Diagnosis not present

## 2023-12-11 DIAGNOSIS — M545 Low back pain, unspecified: Secondary | ICD-10-CM | POA: Diagnosis not present

## 2023-12-11 DIAGNOSIS — M1712 Unilateral primary osteoarthritis, left knee: Secondary | ICD-10-CM | POA: Diagnosis not present

## 2023-12-15 ENCOUNTER — Other Ambulatory Visit (HOSPITAL_COMMUNITY): Payer: Self-pay

## 2023-12-15 MED ORDER — OXYCODONE HCL 5 MG PO TABS
10.0000 mg | ORAL_TABLET | ORAL | 0 refills | Status: DC | PRN
  Filled 2023-12-19: qty 300, 17d supply, fill #0

## 2023-12-19 ENCOUNTER — Other Ambulatory Visit (HOSPITAL_COMMUNITY): Payer: Self-pay

## 2023-12-19 DIAGNOSIS — M25562 Pain in left knee: Secondary | ICD-10-CM | POA: Diagnosis not present

## 2023-12-26 DIAGNOSIS — Z08 Encounter for follow-up examination after completed treatment for malignant neoplasm: Secondary | ICD-10-CM | POA: Diagnosis not present

## 2023-12-26 DIAGNOSIS — Z85828 Personal history of other malignant neoplasm of skin: Secondary | ICD-10-CM | POA: Diagnosis not present

## 2023-12-26 DIAGNOSIS — M1712 Unilateral primary osteoarthritis, left knee: Secondary | ICD-10-CM | POA: Diagnosis not present

## 2024-01-02 DIAGNOSIS — M1712 Unilateral primary osteoarthritis, left knee: Secondary | ICD-10-CM | POA: Diagnosis not present

## 2024-01-16 ENCOUNTER — Other Ambulatory Visit (HOSPITAL_COMMUNITY): Payer: Self-pay

## 2024-01-16 MED ORDER — OXYCODONE HCL 5 MG PO TABS
10.0000 mg | ORAL_TABLET | ORAL | 0 refills | Status: DC | PRN
  Filled 2024-01-19: qty 300, 17d supply, fill #0

## 2024-01-19 ENCOUNTER — Other Ambulatory Visit (HOSPITAL_COMMUNITY): Payer: Self-pay

## 2024-01-30 DIAGNOSIS — M25562 Pain in left knee: Secondary | ICD-10-CM | POA: Diagnosis not present

## 2024-02-15 ENCOUNTER — Other Ambulatory Visit (HOSPITAL_COMMUNITY): Payer: Self-pay

## 2024-02-15 MED ORDER — OXYCODONE HCL 5 MG PO TABS
10.0000 mg | ORAL_TABLET | ORAL | 0 refills | Status: AC | PRN
Start: 2024-02-15 — End: ?
  Filled 2024-02-15: qty 300, 17d supply, fill #0

## 2024-02-17 ENCOUNTER — Other Ambulatory Visit (HOSPITAL_COMMUNITY): Payer: Self-pay

## 2024-02-21 ENCOUNTER — Other Ambulatory Visit (HOSPITAL_COMMUNITY): Payer: Self-pay

## 2024-02-21 DIAGNOSIS — F5101 Primary insomnia: Secondary | ICD-10-CM | POA: Diagnosis not present

## 2024-02-21 DIAGNOSIS — I10 Essential (primary) hypertension: Secondary | ICD-10-CM | POA: Diagnosis not present

## 2024-02-21 DIAGNOSIS — G8929 Other chronic pain: Secondary | ICD-10-CM | POA: Diagnosis not present

## 2024-02-21 DIAGNOSIS — E119 Type 2 diabetes mellitus without complications: Secondary | ICD-10-CM | POA: Diagnosis not present

## 2024-02-21 MED ORDER — OZEMPIC (0.25 OR 0.5 MG/DOSE) 2 MG/3ML ~~LOC~~ SOPN
0.2500 mg | PEN_INJECTOR | SUBCUTANEOUS | 5 refills | Status: DC
  Filled 2024-02-21: qty 3, 28d supply, fill #0
  Filled 2024-03-09 – 2024-03-10 (×2): qty 3, 56d supply, fill #0
  Filled 2024-04-17: qty 3, 56d supply, fill #1
  Filled 2024-06-13: qty 3, 56d supply, fill #2
  Filled 2024-08-08: qty 3, 56d supply, fill #0

## 2024-03-07 ENCOUNTER — Other Ambulatory Visit (HOSPITAL_COMMUNITY): Payer: Self-pay

## 2024-03-07 MED ORDER — METHOCARBAMOL 750 MG PO TABS
750.0000 mg | ORAL_TABLET | Freq: Three times a day (TID) | ORAL | 3 refills | Status: AC
  Filled 2024-03-07: qty 270, 90d supply, fill #0

## 2024-03-09 ENCOUNTER — Other Ambulatory Visit (HOSPITAL_COMMUNITY): Payer: Self-pay

## 2024-03-09 ENCOUNTER — Other Ambulatory Visit (HOSPITAL_BASED_OUTPATIENT_CLINIC_OR_DEPARTMENT_OTHER): Payer: Self-pay

## 2024-03-10 ENCOUNTER — Other Ambulatory Visit (HOSPITAL_COMMUNITY): Payer: Self-pay

## 2024-03-12 ENCOUNTER — Other Ambulatory Visit (HOSPITAL_BASED_OUTPATIENT_CLINIC_OR_DEPARTMENT_OTHER): Payer: Self-pay

## 2024-03-16 ENCOUNTER — Other Ambulatory Visit (HOSPITAL_COMMUNITY): Payer: Self-pay

## 2024-03-16 MED ORDER — OXYCODONE HCL 5 MG PO TABS
ORAL_TABLET | ORAL | 0 refills | Status: AC
  Filled 2024-03-16: qty 300, 30d supply, fill #0

## 2024-03-20 ENCOUNTER — Other Ambulatory Visit (HOSPITAL_COMMUNITY): Payer: Self-pay

## 2024-03-21 ENCOUNTER — Other Ambulatory Visit (HOSPITAL_COMMUNITY): Payer: Self-pay

## 2024-03-21 DIAGNOSIS — M25562 Pain in left knee: Secondary | ICD-10-CM | POA: Diagnosis not present

## 2024-04-11 ENCOUNTER — Other Ambulatory Visit (HOSPITAL_COMMUNITY): Payer: Self-pay

## 2024-04-17 ENCOUNTER — Other Ambulatory Visit (HOSPITAL_COMMUNITY): Payer: Self-pay

## 2024-04-18 ENCOUNTER — Other Ambulatory Visit (HOSPITAL_COMMUNITY): Payer: Self-pay

## 2024-04-18 MED ORDER — OXYCODONE HCL 5 MG PO TABS
ORAL_TABLET | ORAL | 0 refills | Status: AC
  Filled 2024-04-18: qty 300, 30d supply, fill #0

## 2024-04-18 MED ORDER — PREDNISONE 10 MG PO TABS
ORAL_TABLET | ORAL | 0 refills | Status: AC
  Filled 2024-04-18: qty 20, 8d supply, fill #0

## 2024-04-22 DIAGNOSIS — M7110 Other infective bursitis, unspecified site: Secondary | ICD-10-CM | POA: Diagnosis not present

## 2024-05-14 ENCOUNTER — Other Ambulatory Visit (HOSPITAL_COMMUNITY): Payer: Self-pay

## 2024-05-14 MED ORDER — OXYCODONE HCL 5 MG PO TABS
10.0000 mg | ORAL_TABLET | ORAL | 0 refills | Status: DC | PRN
  Filled 2024-05-16: qty 300, 30d supply, fill #0

## 2024-05-16 ENCOUNTER — Other Ambulatory Visit (HOSPITAL_COMMUNITY): Payer: Self-pay

## 2024-05-18 ENCOUNTER — Other Ambulatory Visit: Payer: Self-pay

## 2024-05-18 ENCOUNTER — Other Ambulatory Visit (HOSPITAL_COMMUNITY): Payer: Self-pay

## 2024-05-24 DIAGNOSIS — F5101 Primary insomnia: Secondary | ICD-10-CM | POA: Diagnosis not present

## 2024-05-24 DIAGNOSIS — I10 Essential (primary) hypertension: Secondary | ICD-10-CM | POA: Diagnosis not present

## 2024-05-24 DIAGNOSIS — G8929 Other chronic pain: Secondary | ICD-10-CM | POA: Diagnosis not present

## 2024-05-24 DIAGNOSIS — E119 Type 2 diabetes mellitus without complications: Secondary | ICD-10-CM | POA: Diagnosis not present

## 2024-05-24 DIAGNOSIS — Z125 Encounter for screening for malignant neoplasm of prostate: Secondary | ICD-10-CM | POA: Diagnosis not present

## 2024-05-24 DIAGNOSIS — K219 Gastro-esophageal reflux disease without esophagitis: Secondary | ICD-10-CM | POA: Diagnosis not present

## 2024-06-06 DIAGNOSIS — M1712 Unilateral primary osteoarthritis, left knee: Secondary | ICD-10-CM | POA: Diagnosis not present

## 2024-06-13 ENCOUNTER — Other Ambulatory Visit (HOSPITAL_COMMUNITY): Payer: Self-pay

## 2024-06-19 ENCOUNTER — Other Ambulatory Visit (HOSPITAL_COMMUNITY): Payer: Self-pay

## 2024-06-20 ENCOUNTER — Other Ambulatory Visit (HOSPITAL_COMMUNITY): Payer: Self-pay

## 2024-06-21 ENCOUNTER — Other Ambulatory Visit (HOSPITAL_COMMUNITY): Payer: Self-pay

## 2024-06-21 MED ORDER — OXYCODONE HCL 5 MG PO TABS
10.0000 mg | ORAL_TABLET | ORAL | 0 refills | Status: AC | PRN
  Filled 2024-06-21: qty 297, 16d supply, fill #0
  Filled 2024-06-21: qty 3, 1d supply, fill #0

## 2024-06-25 DIAGNOSIS — Z85828 Personal history of other malignant neoplasm of skin: Secondary | ICD-10-CM | POA: Diagnosis not present

## 2024-06-25 DIAGNOSIS — Z08 Encounter for follow-up examination after completed treatment for malignant neoplasm: Secondary | ICD-10-CM | POA: Diagnosis not present

## 2024-07-17 ENCOUNTER — Other Ambulatory Visit (HOSPITAL_COMMUNITY): Payer: Self-pay

## 2024-07-17 MED ORDER — OXYCODONE HCL 5 MG PO TABS
10.0000 mg | ORAL_TABLET | ORAL | 0 refills | Status: DC | PRN
  Filled 2024-07-17: qty 300, 17d supply, fill #0

## 2024-07-19 ENCOUNTER — Other Ambulatory Visit (HOSPITAL_COMMUNITY): Payer: Self-pay

## 2024-08-08 ENCOUNTER — Other Ambulatory Visit (HOSPITAL_BASED_OUTPATIENT_CLINIC_OR_DEPARTMENT_OTHER): Payer: Self-pay

## 2024-08-13 DIAGNOSIS — S0101XA Laceration without foreign body of scalp, initial encounter: Secondary | ICD-10-CM | POA: Diagnosis not present

## 2024-08-13 DIAGNOSIS — W228XXA Striking against or struck by other objects, initial encounter: Secondary | ICD-10-CM | POA: Diagnosis not present

## 2024-08-16 ENCOUNTER — Other Ambulatory Visit (HOSPITAL_COMMUNITY): Payer: Self-pay

## 2024-08-16 ENCOUNTER — Other Ambulatory Visit: Payer: Self-pay

## 2024-08-16 DIAGNOSIS — G8929 Other chronic pain: Secondary | ICD-10-CM | POA: Diagnosis not present

## 2024-08-16 DIAGNOSIS — F5101 Primary insomnia: Secondary | ICD-10-CM | POA: Diagnosis not present

## 2024-08-16 DIAGNOSIS — K219 Gastro-esophageal reflux disease without esophagitis: Secondary | ICD-10-CM | POA: Diagnosis not present

## 2024-08-16 DIAGNOSIS — E119 Type 2 diabetes mellitus without complications: Secondary | ICD-10-CM | POA: Diagnosis not present

## 2024-08-16 DIAGNOSIS — I10 Essential (primary) hypertension: Secondary | ICD-10-CM | POA: Diagnosis not present

## 2024-08-16 DIAGNOSIS — Z0000 Encounter for general adult medical examination without abnormal findings: Secondary | ICD-10-CM | POA: Diagnosis not present

## 2024-08-16 DIAGNOSIS — S0101XS Laceration without foreign body of scalp, sequela: Secondary | ICD-10-CM | POA: Diagnosis not present

## 2024-08-16 MED ORDER — OXYCODONE HCL 5 MG PO TABS
10.0000 mg | ORAL_TABLET | ORAL | 0 refills | Status: AC | PRN
  Filled 2024-08-16: qty 300, 30d supply, fill #0

## 2024-08-17 DIAGNOSIS — M25562 Pain in left knee: Secondary | ICD-10-CM | POA: Diagnosis not present

## 2024-08-20 DIAGNOSIS — S0101XD Laceration without foreign body of scalp, subsequent encounter: Secondary | ICD-10-CM | POA: Diagnosis not present

## 2024-08-20 DIAGNOSIS — Z4802 Encounter for removal of sutures: Secondary | ICD-10-CM | POA: Diagnosis not present

## 2024-08-22 ENCOUNTER — Other Ambulatory Visit (HOSPITAL_BASED_OUTPATIENT_CLINIC_OR_DEPARTMENT_OTHER): Payer: Self-pay

## 2024-08-22 MED ORDER — MELOXICAM 15 MG PO TABS
15.0000 mg | ORAL_TABLET | Freq: Every day | ORAL | 1 refills | Status: AC | PRN
Start: 2024-08-22 — End: ?
  Filled 2024-08-22: qty 90, 90d supply, fill #0

## 2024-08-24 DIAGNOSIS — M1712 Unilateral primary osteoarthritis, left knee: Secondary | ICD-10-CM | POA: Diagnosis not present

## 2024-09-02 DIAGNOSIS — R030 Elevated blood-pressure reading, without diagnosis of hypertension: Secondary | ICD-10-CM | POA: Diagnosis not present

## 2024-09-02 DIAGNOSIS — T1592XA Foreign body on external eye, part unspecified, left eye, initial encounter: Secondary | ICD-10-CM | POA: Diagnosis not present

## 2024-09-02 DIAGNOSIS — S0502XA Injury of conjunctiva and corneal abrasion without foreign body, left eye, initial encounter: Secondary | ICD-10-CM | POA: Diagnosis not present

## 2024-09-07 ENCOUNTER — Other Ambulatory Visit (HOSPITAL_COMMUNITY): Payer: Self-pay

## 2024-09-07 ENCOUNTER — Other Ambulatory Visit (HOSPITAL_BASED_OUTPATIENT_CLINIC_OR_DEPARTMENT_OTHER): Payer: Self-pay

## 2024-09-07 DIAGNOSIS — M1712 Unilateral primary osteoarthritis, left knee: Secondary | ICD-10-CM | POA: Diagnosis not present

## 2024-09-07 DIAGNOSIS — M25562 Pain in left knee: Secondary | ICD-10-CM | POA: Diagnosis not present

## 2024-09-07 MED ORDER — CELECOXIB 200 MG PO CAPS
200.0000 mg | ORAL_CAPSULE | Freq: Every day | ORAL | 1 refills | Status: AC
  Filled 2024-09-07: qty 10, 10d supply, fill #0
  Filled 2024-09-07: qty 20, 20d supply, fill #0
  Filled 2024-09-07: qty 30, 30d supply, fill #0
  Filled 2024-10-05: qty 30, 30d supply, fill #1
  Filled 2024-10-08: qty 30, 30d supply, fill #0

## 2024-09-12 ENCOUNTER — Other Ambulatory Visit (HOSPITAL_COMMUNITY): Payer: Self-pay

## 2024-09-12 MED ORDER — PREDNISONE 10 MG (21) PO TBPK
ORAL_TABLET | ORAL | 0 refills | Status: DC
  Filled 2024-09-12: qty 21, 6d supply, fill #0

## 2024-09-18 ENCOUNTER — Other Ambulatory Visit (HOSPITAL_BASED_OUTPATIENT_CLINIC_OR_DEPARTMENT_OTHER): Payer: Self-pay

## 2024-09-18 ENCOUNTER — Other Ambulatory Visit (HOSPITAL_COMMUNITY): Payer: Self-pay

## 2024-09-18 MED ORDER — OXYCODONE HCL 5 MG PO TABS
ORAL_TABLET | ORAL | 0 refills | Status: AC
  Filled 2024-09-18: qty 300, 30d supply, fill #0

## 2024-09-18 MED ORDER — PREDNISONE 10 MG (21) PO TBPK
ORAL_TABLET | ORAL | 0 refills | Status: AC
  Filled 2024-09-18: qty 21, 6d supply, fill #0

## 2024-09-19 ENCOUNTER — Other Ambulatory Visit: Payer: Self-pay

## 2024-09-28 ENCOUNTER — Other Ambulatory Visit (HOSPITAL_BASED_OUTPATIENT_CLINIC_OR_DEPARTMENT_OTHER): Payer: Self-pay

## 2024-09-28 MED ORDER — LOSARTAN POTASSIUM 50 MG PO TABS
50.0000 mg | ORAL_TABLET | Freq: Every day | ORAL | 1 refills | Status: AC
  Filled 2024-09-28: qty 90, 90d supply, fill #0

## 2024-10-01 ENCOUNTER — Other Ambulatory Visit (HOSPITAL_BASED_OUTPATIENT_CLINIC_OR_DEPARTMENT_OTHER): Payer: Self-pay

## 2024-10-01 MED ORDER — METHOCARBAMOL 750 MG PO TABS
750.0000 mg | ORAL_TABLET | Freq: Three times a day (TID) | ORAL | 1 refills | Status: AC | PRN
  Filled 2024-10-01: qty 270, 90d supply, fill #0

## 2024-10-05 ENCOUNTER — Other Ambulatory Visit (HOSPITAL_COMMUNITY): Payer: Self-pay

## 2024-10-08 ENCOUNTER — Other Ambulatory Visit (HOSPITAL_COMMUNITY): Payer: Self-pay

## 2024-10-08 ENCOUNTER — Other Ambulatory Visit (HOSPITAL_BASED_OUTPATIENT_CLINIC_OR_DEPARTMENT_OTHER): Payer: Self-pay

## 2024-10-08 ENCOUNTER — Other Ambulatory Visit: Payer: Self-pay

## 2024-10-09 ENCOUNTER — Other Ambulatory Visit (HOSPITAL_BASED_OUTPATIENT_CLINIC_OR_DEPARTMENT_OTHER): Payer: Self-pay

## 2024-10-09 MED ORDER — MOUNJARO 2.5 MG/0.5ML ~~LOC~~ SOAJ
2.5000 mg | SUBCUTANEOUS | 0 refills | Status: DC
  Filled 2024-10-09: qty 2, 28d supply, fill #0

## 2024-10-10 ENCOUNTER — Other Ambulatory Visit (HOSPITAL_BASED_OUTPATIENT_CLINIC_OR_DEPARTMENT_OTHER): Payer: Self-pay

## 2024-10-18 ENCOUNTER — Other Ambulatory Visit (HOSPITAL_COMMUNITY): Payer: Self-pay

## 2024-10-19 ENCOUNTER — Other Ambulatory Visit (HOSPITAL_COMMUNITY): Payer: Self-pay

## 2024-10-19 MED ORDER — OXYCODONE HCL 5 MG PO TABS
10.0000 mg | ORAL_TABLET | ORAL | 0 refills | Status: AC
  Filled 2024-10-19: qty 300, 30d supply, fill #0

## 2024-10-23 ENCOUNTER — Other Ambulatory Visit (HOSPITAL_COMMUNITY): Payer: Self-pay

## 2024-11-03 ENCOUNTER — Other Ambulatory Visit (HOSPITAL_BASED_OUTPATIENT_CLINIC_OR_DEPARTMENT_OTHER): Payer: Self-pay

## 2024-11-05 ENCOUNTER — Other Ambulatory Visit (HOSPITAL_BASED_OUTPATIENT_CLINIC_OR_DEPARTMENT_OTHER): Payer: Self-pay

## 2024-11-06 ENCOUNTER — Other Ambulatory Visit (HOSPITAL_BASED_OUTPATIENT_CLINIC_OR_DEPARTMENT_OTHER): Payer: Self-pay

## 2024-11-06 MED ORDER — MOUNJARO 2.5 MG/0.5ML ~~LOC~~ SOAJ
2.5000 mg | SUBCUTANEOUS | 0 refills | Status: AC
  Filled 2024-11-06: qty 2, 28d supply, fill #0

## 2024-11-07 ENCOUNTER — Other Ambulatory Visit (HOSPITAL_BASED_OUTPATIENT_CLINIC_OR_DEPARTMENT_OTHER): Payer: Self-pay

## 2024-11-07 MED ORDER — CELECOXIB 200 MG PO CAPS
200.0000 mg | ORAL_CAPSULE | Freq: Every day | ORAL | 1 refills | Status: AC | PRN
  Filled 2024-11-07: qty 30, 30d supply, fill #0

## 2024-11-08 ENCOUNTER — Other Ambulatory Visit (HOSPITAL_BASED_OUTPATIENT_CLINIC_OR_DEPARTMENT_OTHER): Payer: Self-pay
